# Patient Record
Sex: Female | Born: 1985 | Hispanic: Yes | Marital: Single | Smoking: Current every day smoker
Health system: Southern US, Community
[De-identification: ages and names within clinical notes are randomized; demographics above are authoritative.]

## PROBLEM LIST (undated history)

## (undated) ENCOUNTER — Inpatient Hospital Stay: Payer: Self-pay

## (undated) DIAGNOSIS — I10 Essential (primary) hypertension: Secondary | ICD-10-CM

## (undated) HISTORY — PX: TONSILLECTOMY: SUR1361

---

## 2018-05-01 ENCOUNTER — Emergency Department
Admission: EM | Admit: 2018-05-01 | Discharge: 2018-05-01 | Disposition: A | Discharge status: Home / Self Care | Payer: Medicaid Other | Attending: Emergency Medicine | Admitting: Emergency Medicine

## 2018-05-01 ENCOUNTER — Encounter: Payer: Medicaid Other | Admitting: Obstetrics and Gynecology

## 2018-05-01 ENCOUNTER — Emergency Department: Payer: Medicaid Other

## 2018-05-01 ENCOUNTER — Other Ambulatory Visit: Payer: Self-pay

## 2018-05-01 DIAGNOSIS — O4422 Partial placenta previa NOS or without hemorrhage, second trimester: Secondary | ICD-10-CM | POA: Insufficient documentation

## 2018-05-01 DIAGNOSIS — O26852 Spotting complicating pregnancy, second trimester: Secondary | ICD-10-CM | POA: Insufficient documentation

## 2018-05-01 DIAGNOSIS — Z3A17 17 weeks gestation of pregnancy: Secondary | ICD-10-CM | POA: Diagnosis not present

## 2018-05-01 DIAGNOSIS — O209 Hemorrhage in early pregnancy, unspecified: Secondary | ICD-10-CM | POA: Diagnosis present

## 2018-05-01 DIAGNOSIS — R102 Pelvic and perineal pain: Secondary | ICD-10-CM | POA: Diagnosis not present

## 2018-05-01 DIAGNOSIS — O4690 Antepartum hemorrhage, unspecified, unspecified trimester: Secondary | ICD-10-CM

## 2018-05-01 DIAGNOSIS — O4402 Placenta previa specified as without hemorrhage, second trimester: Secondary | ICD-10-CM

## 2018-05-01 LAB — CBC WITH DIFFERENTIAL/PLATELET
Abs Immature Granulocytes: 0.02 10*3/uL (ref 0.00–0.07)
Basophils Absolute: 0 10*3/uL (ref 0.0–0.1)
Basophils Relative: 0 %
Eosinophils Absolute: 0.1 10*3/uL (ref 0.0–0.5)
Eosinophils Relative: 2 %
HCT: 34.5 % — ABNORMAL LOW (ref 36.0–46.0)
Hemoglobin: 11.3 g/dL — ABNORMAL LOW (ref 12.0–15.0)
Immature Granulocytes: 0 %
Lymphocytes Relative: 30 %
Lymphs Abs: 1.7 10*3/uL (ref 0.7–4.0)
MCH: 28 pg (ref 26.0–34.0)
MCHC: 32.8 g/dL (ref 30.0–36.0)
MCV: 85.4 fL (ref 80.0–100.0)
MONO ABS: 0.3 10*3/uL (ref 0.1–1.0)
Monocytes Relative: 6 %
NEUTROS ABS: 3.3 10*3/uL (ref 1.7–7.7)
Neutrophils Relative %: 62 %
Platelets: 196 10*3/uL (ref 150–400)
RBC: 4.04 MIL/uL (ref 3.87–5.11)
RDW: 12.4 % (ref 11.5–15.5)
WBC: 5.5 10*3/uL (ref 4.0–10.5)
nRBC: 0 % (ref 0.0–0.2)

## 2018-05-01 LAB — URINALYSIS, COMPLETE (UACMP) WITH MICROSCOPIC
Bilirubin Urine: NEGATIVE
Glucose, UA: NEGATIVE mg/dL
Hgb urine dipstick: NEGATIVE
Ketones, ur: NEGATIVE mg/dL
Leukocytes, UA: NEGATIVE
Nitrite: NEGATIVE
Protein, ur: NEGATIVE mg/dL
Specific Gravity, Urine: 1.031 — ABNORMAL HIGH (ref 1.005–1.030)
pH: 5 (ref 5.0–8.0)

## 2018-05-01 LAB — BASIC METABOLIC PANEL
Anion gap: 5 (ref 5–15)
BUN: 9 mg/dL (ref 6–20)
CO2: 25 mmol/L (ref 22–32)
Calcium: 8.6 mg/dL — ABNORMAL LOW (ref 8.9–10.3)
Chloride: 105 mmol/L (ref 98–111)
Creatinine, Ser: 0.35 mg/dL — ABNORMAL LOW (ref 0.44–1.00)
GFR calc Af Amer: >60 mL/min (ref >60)
GFR calc non Af Amer: >60 mL/min (ref >60)
Glucose, Bld: 91 mg/dL (ref 70–99)
Potassium: 3.3 mmol/L — ABNORMAL LOW (ref 3.5–5.1)
Sodium: 135 mmol/L (ref 135–145)

## 2018-05-01 LAB — CHLAMYDIA/NGC RT PCR (ARMC ONLY)
Chlamydia Tr: NOT DETECTED
N gonorrhoeae: NOT DETECTED

## 2018-05-01 LAB — HCG, QUANTITATIVE, PREGNANCY: hCG, Beta Chain, Quant, S: 22329 m[IU]/mL — ABNORMAL HIGH (ref <5)

## 2018-05-01 LAB — TYPE AND SCREEN
ABO/RH(D): A POS
Antibody Screen: NEGATIVE

## 2018-05-01 MED ORDER — ONDANSETRON 4 MG PO TBDP
4.0000 mg | ORAL_TABLET | Freq: Once | ORAL | Status: AC
  Administered 2018-05-01: 4 mg via ORAL
  Filled 2018-05-01: qty 1

## 2018-05-01 MED ORDER — ONDANSETRON 4 MG PO TBDP: 4.0000 mg | ORAL_TABLET | Freq: Three times a day (TID) | ORAL | 0 refills | Status: AC | PRN

## 2018-05-01 NOTE — ED Triage Notes (Signed)
Pt reports that she is [redacted] weeks pregnant states that she started feeling bad yesterday and began vomiting states that she has been under a lot of stress, states that today around 1400 she started with bright red vaginal bleeding and cramping across her lower abd,. Pt states that the pain reminds her of labor, denies pain with urination

## 2018-05-01 NOTE — ED Provider Notes (Signed)
[facility] Emergency Department Provider Note       Time seen: ----------------------------------------- 6:57 PM on 05/01/2018 -----------------------------------------   I have reviewed the triage vital signs and the nursing notes.  HISTORY   Chief Complaint Abdominal Pain and Vaginal Bleeding   HPI Charlotte Stewart is a 33 y.o. female with no significant past medical history who presents to the ED for vaginal bleeding and pregnancy.  Patient states she is around [redacted] weeks pregnant.  She started feeling bad yesterday and began vomiting.  She reports she is under a lot of stress.  Has some bleeding and cramping across her lower abdomen that reminds her of labor.  She is G6 P3 Ab2.  No past medical history on file.  There are no active problems to display for this patient.   Allergies Aspirin  Social History Social History   Tobacco Use  . Smoking status: Not on file  Substance Use Topics  . Alcohol use: Not on file  . Drug use: Not on file    Review of Systems Constitutional: Negative for fever. Cardiovascular: Negative for chest pain. Respiratory: Negative for shortness of breath. Gastrointestinal: Positive for abdominal pain, vomiting Genitourinary: Positive for vaginal bleeding. Musculoskeletal: Negative for back pain. Skin: Negative for rash. Neurological: Negative for headaches, focal weakness or numbness.  All systems negative/normal/unremarkable except as stated in the HPI  ____________________________________________   PHYSICAL EXAM:  VITAL SIGNS: ED Triage Vitals [05/01/18 1836]  Enc Vitals Group     BP 127/63     Pulse Rate 95     Resp 18     Temp 98.2 F (36.8 C)     Temp Source Oral     SpO2 99 %     Weight 255 lb (115.7 kg)     Height 5\' 2"  (1.575 m)     Head Circumference      Peak Flow      Pain Score 8     Pain Loc      Pain Edu?      Excl. in GC?    Constitutional: Alert and oriented. Well appearing and  in no distress. Eyes: Conjunctivae are normal. Normal extraocular movements. Cardiovascular: Normal rate, regular rhythm. No murmurs, rubs, or gallops. Respiratory: Normal respiratory effort without tachypnea nor retractions. Breath sounds are clear and equal bilaterally. No wheezes/rales/rhonchi. Gastrointestinal: Nonfocal tenderness, normal bowel sounds Musculoskeletal: Nontender with normal range of motion in extremities. No lower extremity tenderness nor edema. Neurologic:  Normal speech and language. No gross focal neurologic deficits are appreciated.  Skin:  Skin is warm, dry and intact. No rash noted. Psychiatric: Mood and affect are normal. Speech and behavior are normal.  ____________________________________________  ED COURSE:  As part of my medical decision making, I reviewed the following data within the electronic MEDICAL RECORD NUMBER History obtained from family if available, nursing notes, old chart and ekg, as well as notes from prior ED visits. Patient presented for abdominal pain and vaginal bleeding, we will assess with labs and imaging as indicated at this time.   Procedures ____________________________________________   LABS (pertinent positives/negatives)  Labs Reviewed  HCG, QUANTITATIVE, PREGNANCY - Abnormal; Notable for the following components:      Result Value   hCG, Beta Chain, Quant, S 22,329 (*)    All other components within normal limits  URINALYSIS, COMPLETE (UACMP) WITH MICROSCOPIC - Abnormal; Notable for the following components:   Color, Urine YELLOW (*)    APPearance CLEAR (*)  Specific Gravity, Urine 1.031 (*)    Bacteria, UA RARE (*)    All other components within normal limits  CHLAMYDIA/NGC RT PCR ([facility] ONLY)  CBC WITH DIFFERENTIAL/PLATELET  BASIC METABOLIC PANEL  TYPE AND SCREEN    RADIOLOGY Images were viewed by me  Pregnancy ultrasound IMPRESSION: 1. Single live IUP. 2. No cause for bleeding or pain identified. 3. The  placenta is 1.5 cm from the internal cervical os consistent with a marginal previa. Recommend attention on follow-up.  This exam is performed on an emergent basis and does not comprehensively evaluate fetal size, dating, or anatomy; follow-up complete OB US should be considered if further fetal assessment is warranted. ____________________________________________   DIFFERENTIAL DIAGNOSIS   Miscarriage, abruptio placentae, placenta previa, normal pregnancy  FINAL ASSESSMENT AND PLAN  Vaginal bleeding in the second trimester, marginal placenta previa   Plan: The patient had presented for vaginal bleeding in the second trimester. Patient's labs are reassuring. Patient's imaging revealed marginal placenta previa.  Otherwise no specific etiology is discovered as to why she has been bleeding.  She is encouraged to have close outpatient follow-up.   Ulice Dash, MD    Note: This note was generated in part or whole with voice recognition software. Voice recognition is usually quite accurate but there are transcription errors that can and very often do occur. I apologize for any typographical errors that were not detected and corrected.     Emily Filbert, MD 05/01/18 2105

## 2018-06-11 ENCOUNTER — Observation Stay: Payer: Medicaid Other

## 2018-06-11 ENCOUNTER — Observation Stay
Admission: EM | Admit: 2018-06-11 | Discharge: 2018-06-11 | Disposition: A | Discharge status: Home / Self Care | Payer: Medicaid Other | Attending: Obstetrics and Gynecology | Admitting: Obstetrics and Gynecology

## 2018-06-11 ENCOUNTER — Encounter: Payer: Self-pay | Admitting: *Deleted

## 2018-06-11 DIAGNOSIS — Z3A21 21 weeks gestation of pregnancy: Secondary | ICD-10-CM | POA: Insufficient documentation

## 2018-06-11 DIAGNOSIS — O4412 Placenta previa with hemorrhage, second trimester: Principal | ICD-10-CM | POA: Insufficient documentation

## 2018-06-11 DIAGNOSIS — O4400 Placenta previa specified as without hemorrhage, unspecified trimester: Secondary | ICD-10-CM

## 2018-06-11 DIAGNOSIS — W109XXA Fall (on) (from) unspecified stairs and steps, initial encounter: Secondary | ICD-10-CM

## 2018-06-11 DIAGNOSIS — O9A212 Injury, poisoning and certain other consequences of external causes complicating pregnancy, second trimester: Secondary | ICD-10-CM | POA: Diagnosis not present

## 2018-06-11 DIAGNOSIS — O4440 Low lying placenta NOS or without hemorrhage, unspecified trimester: Secondary | ICD-10-CM

## 2018-06-11 DIAGNOSIS — O09292 Supervision of pregnancy with other poor reproductive or obstetric history, second trimester: Secondary | ICD-10-CM

## 2018-06-11 DIAGNOSIS — Z3A20 20 weeks gestation of pregnancy: Secondary | ICD-10-CM

## 2018-06-11 LAB — COMPREHENSIVE METABOLIC PANEL
ALT: 24 U/L (ref 0–44)
AST: 19 U/L (ref 15–41)
Albumin: 3.4 g/dL — ABNORMAL LOW (ref 3.5–5.0)
Alkaline Phosphatase: 80 U/L (ref 38–126)
Anion gap: 11 (ref 5–15)
BUN: 9 mg/dL (ref 6–20)
CO2: 21 mmol/L — ABNORMAL LOW (ref 22–32)
Calcium: 9.4 mg/dL (ref 8.9–10.3)
Chloride: 105 mmol/L (ref 98–111)
Creatinine, Ser: 0.35 mg/dL — ABNORMAL LOW (ref 0.44–1.00)
GFR calc non Af Amer: >60 mL/min (ref >60)
Glucose, Bld: 99 mg/dL (ref 70–99)
Potassium: 3.7 mmol/L (ref 3.5–5.1)
Sodium: 137 mmol/L (ref 135–145)
Total Bilirubin: 0.3 mg/dL (ref 0.3–1.2)
Total Protein: 6.8 g/dL (ref 6.5–8.1)

## 2018-06-11 LAB — URINALYSIS, COMPLETE (UACMP) WITH MICROSCOPIC
Bacteria, UA: NONE SEEN
Bilirubin Urine: NEGATIVE
GLUCOSE, UA: NEGATIVE mg/dL
Hgb urine dipstick: NEGATIVE
Ketones, ur: 5 mg/dL — AB
Leukocytes,Ua: NEGATIVE
Nitrite: NEGATIVE
Protein, ur: NEGATIVE mg/dL
Specific Gravity, Urine: 1.024 (ref 1.005–1.030)
pH: 6 (ref 5.0–8.0)

## 2018-06-11 LAB — KLEIHAUER-BETKE STAIN
Fetal Cells %: 0 %
QUANTITATION FETAL HEMOGLOBIN: 0 mL

## 2018-06-11 LAB — PROTEIN / CREATININE RATIO, URINE
Creatinine, Urine: 135 mg/dL
Protein Creatinine Ratio: 0.05 mg/mg{Cre} (ref 0.00–0.15)
Total Protein, Urine: 7 mg/dL

## 2018-06-11 LAB — CBC
HCT: 32 % — ABNORMAL LOW (ref 36.0–46.0)
Hemoglobin: 10.8 g/dL — ABNORMAL LOW (ref 12.0–15.0)
MCH: 28.3 pg (ref 26.0–34.0)
MCHC: 33.8 g/dL (ref 30.0–36.0)
MCV: 83.8 fL (ref 80.0–100.0)
Platelets: 184 10*3/uL (ref 150–400)
RBC: 3.82 MIL/uL — ABNORMAL LOW (ref 3.87–5.11)
RDW: 12.6 % (ref 11.5–15.5)
WBC: 8.5 10*3/uL (ref 4.0–10.5)
nRBC: 0 % (ref 0.0–0.2)

## 2018-06-11 LAB — URINE DRUG SCREEN, QUALITATIVE (ARMC ONLY)
Amphetamines, Ur Screen: NOT DETECTED
Barbiturates, Ur Screen: NOT DETECTED
Benzodiazepine, Ur Scrn: NOT DETECTED
Cannabinoid 50 Ng, Ur ~~LOC~~: NOT DETECTED
Cocaine Metabolite,Ur ~~LOC~~: NOT DETECTED
MDMA (Ecstasy)Ur Screen: NOT DETECTED
METHADONE SCREEN, URINE: NOT DETECTED
Opiate, Ur Screen: NOT DETECTED
Phencyclidine (PCP) Ur S: NOT DETECTED
TRICYCLIC, UR SCREEN: NOT DETECTED

## 2018-06-11 MED ORDER — BUPRENORPHINE HCL-NALOXONE HCL 8-2 MG SL SUBL: 1.0000 | SUBLINGUAL_TABLET | Freq: Once | SUBLINGUAL | Status: DC

## 2018-06-11 MED ORDER — LACTATED RINGERS IV SOLN: INTRAVENOUS | Status: DC

## 2018-06-11 MED ORDER — LACTATED RINGERS IV BOLUS: 1000.0000 mL | Freq: Once | INTRAVENOUS | Status: DC

## 2018-06-11 MED ORDER — BUPRENORPHINE HCL 8 MG SL SUBL
8.0000 mg | SUBLINGUAL_TABLET | Freq: Two times a day (BID) | SUBLINGUAL | Status: DC
  Administered 2018-06-11: 8 mg via SUBLINGUAL
  Filled 2018-06-11 (×2): qty 1

## 2018-06-11 NOTE — Discharge Instructions (Signed)
Preterm Labor and Birth Information °Pregnancy normally lasts 39-41 weeks. Preterm labor is when labor starts early. It starts before you have been pregnant for 37 whole weeks. °What are the risk factors for preterm labor? °Preterm labor is more likely to occur in women who: °· Have an infection while pregnant. °· Have a cervix that is short. °· Have gone into preterm labor before. °· Have had surgery on their cervix. °· Are younger than age 31. °· Are older than age 35. °· Are African American. °· Are pregnant with two or more babies. °· Take street drugs while pregnant. °· Smoke while pregnant. °· Do not gain enough weight while pregnant. °· Got pregnant right after another pregnancy. °What are the symptoms of preterm labor? °Symptoms of preterm labor include: °· Cramps. The cramps may feel like the cramps some women get during their period. The cramps may happen with watery poop (diarrhea). °· Pain in the belly (abdomen). °· Pain in the lower back. °· Regular contractions or tightening. It may feel like your belly is getting tighter. °· Pressure in the lower belly that seems to get stronger. °· More fluid (discharge) leaking from the vagina. The fluid may be watery or bloody. °· Water breaking. °Why is it important to notice signs of preterm labor? °Babies who are born early may not be fully developed. They have a higher chance for: °· Long-term heart problems. °· Long-term lung problems. °· Trouble controlling body systems, like breathing. °· Bleeding in the brain. °· A condition called cerebral palsy. °· Learning difficulties. °· Death. °These risks are highest for babies who are born before 34 weeks of pregnancy. °How is preterm labor treated? °Treatment depends on: °· How long you were pregnant. °· Your condition. °· The health of your baby. °Treatment may involve: °· Having a stitch (suture) placed in your cervix. When you give birth, your cervix opens so the baby can come out. The stitch keeps the cervix  from opening too soon. °· Staying at the hospital. °· Taking or getting medicines, such as: °? Hormone medicines. °? Medicines to stop contractions. °? Medicines to help the baby’s lungs develop. °? Medicines to prevent your baby from having cerebral palsy. °What should I do if I am in preterm labor? °If you think you are going into labor too soon, call your doctor right away. °How can I prevent preterm labor? °· Do not use any tobacco products. °? Examples of these are cigarettes, chewing tobacco, and e-cigarettes. °? If you need help quitting, ask your doctor. °· Do not use street drugs. °· Do not use any medicines unless you ask your doctor if they are safe for you. °· Talk with your doctor before taking any herbal supplements. °· Make sure you gain enough weight. °· Watch for infection. If you think you might have an infection, get it checked right away. °· If you have gone into preterm labor before, tell your doctor. °This information is not intended to replace advice given to you by your health care provider. Make sure you discuss any questions you have with your health care provider. °Document Released: 06/28/2008 Document Revised: 09/12/2015 Document Reviewed: 08/23/2015 °Elsevier Interactive Patient Education © 2019 [employer] ° °
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
## 2018-06-12 LAB — URINE CULTURE

## 2018-06-12 NOTE — Progress Notes (Signed)
This was a patient you saw in triage

## 2018-07-08 ENCOUNTER — Other Ambulatory Visit: Payer: Self-pay

## 2018-07-08 ENCOUNTER — Observation Stay
Admission: EM | Admit: 2018-07-08 | Discharge: 2018-07-08 | Discharge status: Against Medical Advice | Payer: Medicaid Other | Attending: Obstetrics and Gynecology | Admitting: Obstetrics and Gynecology

## 2018-07-08 DIAGNOSIS — Z3A26 26 weeks gestation of pregnancy: Secondary | ICD-10-CM | POA: Insufficient documentation

## 2018-07-08 DIAGNOSIS — O26892 Other specified pregnancy related conditions, second trimester: Principal | ICD-10-CM | POA: Insufficient documentation

## 2018-07-08 DIAGNOSIS — R110 Nausea: Secondary | ICD-10-CM | POA: Diagnosis present

## 2018-07-08 HISTORY — DX: Essential (primary) hypertension: I10

## 2018-07-08 MED ORDER — LACTATED RINGERS IV BOLUS: 500.0000 mL | Freq: Once | INTRAVENOUS | Status: DC

## 2018-07-08 MED ORDER — ONDANSETRON HCL 4 MG/2ML IJ SOLN: 4.0000 mg | Freq: Four times a day (QID) | INTRAMUSCULAR | Status: DC | PRN

## 2018-07-08 MED ORDER — ACETAMINOPHEN 325 MG PO TABS: 650.0000 mg | ORAL_TABLET | ORAL | Status: DC | PRN

## 2018-07-08 NOTE — OB Triage Note (Signed)
Pt reports she is a G5P3 at 26w with c/o N/V/D since yesterday and hasnt been able to keep anything down. Pt reports she is dehydrated and has body aches. VSS. Monitors applied. Pt reports she has had some recent clearish d/c. Pt reports she has been taking 8mg  of subutex for 4-5 years. Pt also takes wellbutrin and zoloft. Pt reports that she hasnt taken any of these meds since she ran out on Monday. Pt reports that she goes to [facility] for her prescriptions however she states her doctor was not there on Monday to refill. Report given to [facility]. Will continue to monitor.

## 2018-07-08 NOTE — Progress Notes (Signed)
At 1710 pt states she is going to leave due to not being able to get subutex here. Pt left AMA at 1715. Provider notified.

## 2018-07-08 NOTE — Final Progress Note (Signed)
Patient arrived in triage with multiple complaints, including nausea with inability to tolerate food and fluids for two days and a recent  increase in vaginal discharge. She noted that she usually takes Subutex and has not been able to get her prescription refilled at [facility] because the authorizing provider was not there on Monday. Therefore, she has not taken any Subutex since then and feels that she is withdrawing. I discussed the plan of care to assess for dehydration, give IV fluids and antiemetics, and collect a wet prep with her. She then asked if it would be possible for her to receive Subutex in triage. After hearing that I did not feel comfortable without authorization from her prescribing provider, she stated that she planned to leave. I asked her to wait while I conferred with Dr. Jerene Pitch. During the time that I was away from the room on the telephone, the patient decided to leave AMA and refused to sign any paperwork when it was presented by the RN.

## 2018-09-30 ENCOUNTER — Other Ambulatory Visit: Payer: Self-pay

## 2018-09-30 ENCOUNTER — Observation Stay
Admission: EM | Admit: 2018-09-30 | Discharge: 2018-09-30 | Disposition: A | Discharge status: Home / Self Care | Payer: Medicaid Other | Attending: Obstetrics & Gynecology | Admitting: Obstetrics & Gynecology

## 2018-09-30 DIAGNOSIS — Z886 Allergy status to analgesic agent status: Secondary | ICD-10-CM | POA: Diagnosis not present

## 2018-09-30 DIAGNOSIS — Z3A35 35 weeks gestation of pregnancy: Secondary | ICD-10-CM

## 2018-09-30 DIAGNOSIS — R109 Unspecified abdominal pain: Secondary | ICD-10-CM

## 2018-09-30 DIAGNOSIS — O10913 Unspecified pre-existing hypertension complicating pregnancy, third trimester: Secondary | ICD-10-CM | POA: Insufficient documentation

## 2018-09-30 DIAGNOSIS — O26893 Other specified pregnancy related conditions, third trimester: Secondary | ICD-10-CM

## 2018-09-30 DIAGNOSIS — O99333 Smoking (tobacco) complicating pregnancy, third trimester: Secondary | ICD-10-CM | POA: Diagnosis not present

## 2018-09-30 DIAGNOSIS — Z79899 Other long term (current) drug therapy: Secondary | ICD-10-CM | POA: Insufficient documentation

## 2018-09-30 DIAGNOSIS — F17210 Nicotine dependence, cigarettes, uncomplicated: Secondary | ICD-10-CM | POA: Diagnosis not present

## 2018-09-30 LAB — URINALYSIS, COMPLETE (UACMP) WITH MICROSCOPIC
Bilirubin Urine: NEGATIVE
Glucose, UA: NEGATIVE mg/dL
Hgb urine dipstick: NEGATIVE
Ketones, ur: NEGATIVE mg/dL
Nitrite: NEGATIVE
Protein, ur: NEGATIVE mg/dL
Specific Gravity, Urine: 1.023 (ref 1.005–1.030)
pH: 7 (ref 5.0–8.0)

## 2018-09-30 LAB — URINE DRUG SCREEN, QUALITATIVE (ARMC ONLY)
Amphetamines, Ur Screen: POSITIVE — AB
Barbiturates, Ur Screen: NOT DETECTED
Benzodiazepine, Ur Scrn: NOT DETECTED
Cannabinoid 50 Ng, Ur ~~LOC~~: NOT DETECTED
Cocaine Metabolite,Ur ~~LOC~~: NOT DETECTED
MDMA (Ecstasy)Ur Screen: NOT DETECTED
Methadone Scn, Ur: NOT DETECTED
Opiate, Ur Screen: NOT DETECTED
Phencyclidine (PCP) Ur S: NOT DETECTED
Tricyclic, Ur Screen: NOT DETECTED

## 2018-09-30 LAB — RUPTURE OF MEMBRANE (ROM)PLUS: Rom Plus: NEGATIVE

## 2018-09-30 MED ORDER — CEPHALEXIN 500 MG PO CAPS: 500.0000 mg | ORAL_CAPSULE | Freq: Two times a day (BID) | ORAL | 0 refills | Status: AC

## 2018-09-30 MED ORDER — ACETAMINOPHEN 500 MG PO TABS: 1000.0000 mg | ORAL_TABLET | Freq: Four times a day (QID) | ORAL | Status: DC | PRN

## 2018-09-30 NOTE — OB Triage Note (Addendum)
Pt G6P3 presents to L&D via EMS c/o contractions 5 minutes apart lasting 1-3 minutes and leaking of fluid. Denies vaginal bleeding and states positive fetal movement. Pt was seen at [facility] for a labor eval at 5am. Pt's  cervix was checked, pt was 3cm and did not proress, pt was sent home from [facility]. Pt went home and contractions continued which promoted pt to come to [facility] to get evaluated. External monitors applied, Initial FHT 155. VS WNL. Deeann Saint, CNM at bedside. ROM plus test sent.

## 2018-09-30 NOTE — Discharge Summary (Signed)
See Final Progress Note 09/30/2018.

## 2018-09-30 NOTE — Final Progress Note (Signed)
Physician Final Progress Note  Patient ID: Charlotte Stewart MRN: [id number] DOB/AGE: 31/09/1985 30 y.o.  Admit date: 09/30/2018 Admitting provider: Nadara Mustardobert P Harris, MD Discharge date: 09/30/2018  Admission Diagnoses: Abdominal pain in pregnancy  Discharge Diagnoses: Uterine irritability, UTI  History of Present Illness: The patient is a 33 y.o. female [phone] [id number] at [id number] who presents for intermittent abdominal pain since 0100 this morning. She was seen in triage at [facility] this morning around 0900 and states that she was found to be 3 cm dilated and discharged because her cervix did not change. She rates her pain as 10/10. She has not taken any medications except Subutex this morning. She complains that she feels wet like some fluid is leaking. She denies vaginal bleeding. Her baby is active and moving well.  Review of Systems: Review of systems negative unless otherwise noted in HPI.   Past Medical History:  Diagnosis Date  . Hypertension     History reviewed. No pertinent surgical history.  No current facility-administered medications on file prior to encounter.    Current Outpatient Medications on File Prior to Encounter  Medication Sig Dispense Refill  . buprenorphine (SUBUTEX) 8 MG SUBL SL tablet Place under the tongue 2 (two) times daily.    . ondansetron (ZOFRAN ODT) 4 MG disintegrating tablet Take 1 tablet (4 mg total) by mouth every 8 (eight) hours as needed for nausea or vomiting. 20 tablet 0  . buPROPion (WELLBUTRIN) 100 MG tablet Take 100 mg by mouth once.    . sertraline (ZOLOFT) 50 MG tablet Take 50 mg by mouth daily.      Allergies  Allergen Reactions  . Aspirin Other (See Comments)    Nose bleeds  . Strawberry Extract     Social History   Socioeconomic History  . Marital status: Single    Spouse name: Not on file  . Number of children: Not on file  . Years of education: Not on file  . Highest education level: Not on file  Occupational History  . Not  on file  Social Needs  . Financial resource strain: Not on file  . Food insecurity    Worry: Not on file    Inability: Not on file  . Transportation needs    Medical: Not on file    Non-medical: Not on file  Tobacco Use  . Smoking status: Current Every Day Smoker    Packs/day: 0.25  Substance and Sexual Activity  . Alcohol use: Not Currently  . Drug use: Not Currently  . Sexual activity: Not Currently  Lifestyle  . Physical activity    Days per week: Not on file    Minutes per session: Not on file  . Stress: Not on file  Relationships  . Social Musicianconnections    Talks on phone: Not on file    Gets together: Not on file    Attends religious service: Not on file    Active member of club or organization: Not on file    Attends meetings of clubs or organizations: Not on file    Relationship status: Not on file  . Intimate partner violence    Fear of current or ex partner: Not on file    Emotionally abused: Not on file    Physically abused: Not on file    Forced sexual activity: Not on file  Other Topics Concern  . Not on file  Social History Narrative  . Not on file    Family history: Negative/unremarkable except as  detailed in HPI.    Physical Exam: BP 124/67   Pulse 81   Temp 98.4 F (36.9 C) (Oral)   Resp 18   Ht 5\' 2"  (1.575 m)   Wt 117.9 kg   LMP 12/31/2017   BMI 47.55 kg/m   Gen: Appears distressed, moving around in bed frequently Pulm: No increased work of breathing Abdomen: gravid, non-tender, no palpable uterine contractions Pelvic: 3/50/-3, cervix posterior, no fluid visible. Cervical exam unchanged on recheck after 1.5 hours of observation Ext: no signs of DVT, excoriation on skin BLE  NST Baseline: 145 Variability: moderate Accelerations: present Decelerations: absent Tocometry: uterine irritability, occasional contraction The patient was monitored for >30 minutes, fetal heart rate tracing was deemed reactive.  Significant Findings/ Diagnostic  Studies: labs: ROM Plus Negative, UA positive for small leukocytes and many bacteria  Procedures: NST  Discharge Condition: stable  Disposition: Discharge disposition: 01-Home or Self Care       Diet: Regular diet  Discharge Activity: Activity as tolerated  Discharge Instructions    Discharge activity:  No Restrictions   Complete by: As directed    Discharge diet:  No restrictions   Complete by: As directed    Fetal Kick Count:  Lie on our left side for one hour after a meal, and count the number of times your baby kicks.  If it is less than 5 times, get up, move around and drink some juice.  Repeat the test 30 minutes later.  If it is still less than 5 kicks in an hour, notify your doctor.   Complete by: As directed    LABOR:  When conractions begin, you should start to time them from the beginning of one contraction to the beginning  of the next.  When contractions are 5 - 10 minutes apart or less and have been regular for at least an hour, you should call your health care provider.   Complete by: As directed    No sexual activity restrictions   Complete by: As directed    Notify physician for bleeding from the vagina   Complete by: As directed    Notify physician for blurring of vision or spots before the eyes   Complete by: As directed    Notify physician for chills or fever   Complete by: As directed    Notify physician for fainting spells, "black outs" or loss of consciousness   Complete by: As directed    Notify physician for increase in vaginal discharge   Complete by: As directed    Notify physician for leaking of fluid   Complete by: As directed    Notify physician for pain or burning when urinating   Complete by: As directed    Notify physician for pelvic pressure (sudden increase)   Complete by: As directed    Notify physician for severe or continued nausea or vomiting   Complete by: As directed    Notify physician for sudden gushing of fluid from the vagina  (with or without continued leaking)   Complete by: As directed    Notify physician for sudden, constant, or occasional abdominal pain   Complete by: As directed    Notify physician if baby moving less than usual   Complete by: As directed      Allergies as of 09/30/2018      Reactions   Aspirin Other (See Comments)   Nose bleeds   Strawberry Extract       Medication List  TAKE these medications   buprenorphine 8 MG Subl SL tablet Commonly known as: SUBUTEX Place under the tongue 2 (two) times daily.   buPROPion 100 MG tablet Commonly known as: WELLBUTRIN Take 100 mg by mouth once.   cephALEXin 500 MG capsule Commonly known as: KEFLEX Take 1 capsule (500 mg total) by mouth 2 (two) times daily for 7 days.   ondansetron 4 MG disintegrating tablet Commonly known as: Zofran ODT Take 1 tablet (4 mg total) by mouth every 8 (eight) hours as needed for nausea or vomiting.   sertraline 50 MG tablet Commonly known as: ZOLOFT Take 50 mg by mouth daily.      No evidence of ROM or preterm labor. Discussed findings suggestive of urinary tract infection and that she is most likely experiencing uterine irritability because of this. Prescribed antibiotics and suggested Tylenol, hydration, and rest with Benadryl for sleep if needed. Patient states that she missed her scheduled prenatal appointment today because she was here, advised to follow up with provider at [facility] to be seen for a prenatal visit.  Signed: Rexene Agent, CNM  09/30/2018, 6:12 PM

## 2018-10-02 LAB — URINE CULTURE: Culture: <10000 — AB

## 2018-11-09 ENCOUNTER — Telehealth: Payer: Self-pay | Admitting: Licensed Clinical Social Worker

## 2018-11-09 NOTE — Telephone Encounter (Signed)
Referred by Kieth Brightly due to postpartum symptoms. LCSW attempted to call patient - left vm encouraging pt. To return call to office.

## 2018-11-10 NOTE — Telephone Encounter (Signed)
Spoke with patient and informed her that she was referred by Kieth Brightly. Patient declined services and reported that she has a psychiatrist whom she is seeing today and that she already talks with so many people. LCSW encouraged patient to reach out in future, is needed.

## 2019-01-21 ENCOUNTER — Encounter (HOSPITAL_COMMUNITY): Payer: Self-pay

## 2019-02-10 IMAGING — US US OB LIMITED
1 series · 14 of 28 positions shown · non-contrast
Comparison: none

CLINICAL DATA: The patient is 17 weeks pregnant by report.
Abdominal pain and vaginal bleeding.

EXAM:
LIMITED OBSTETRIC ULTRASOUND

[Series 1: us ob limited · 14 of 52 slices shown]
[im 2/52]
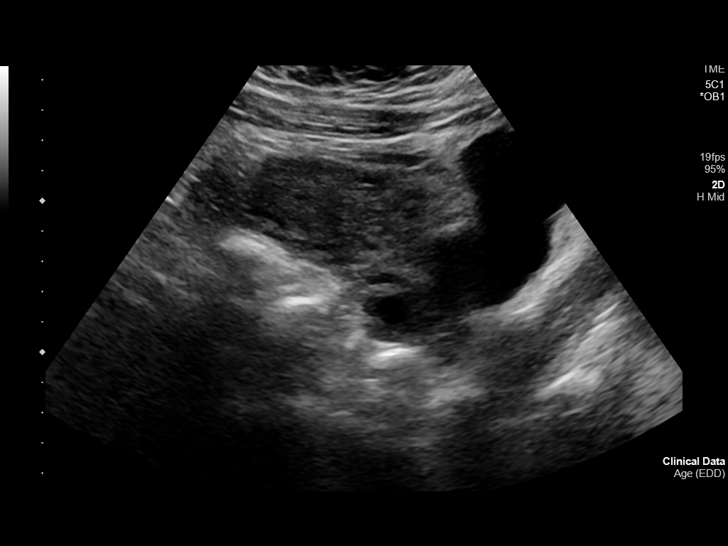
[im 6/52]
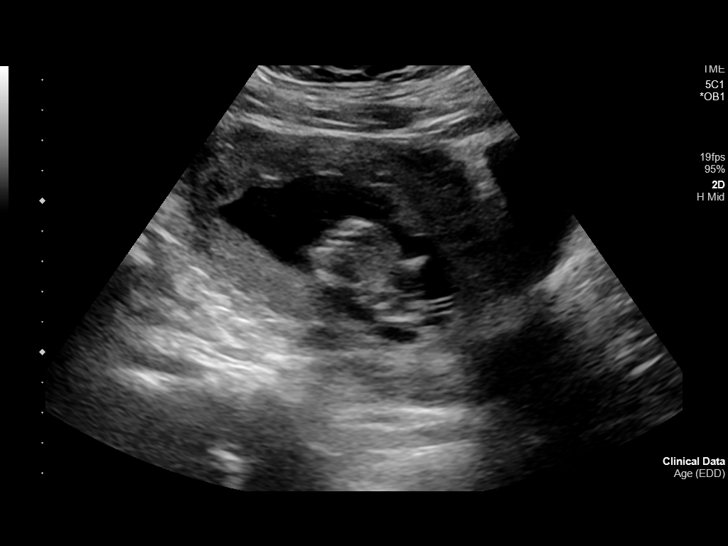
[im 10/52]
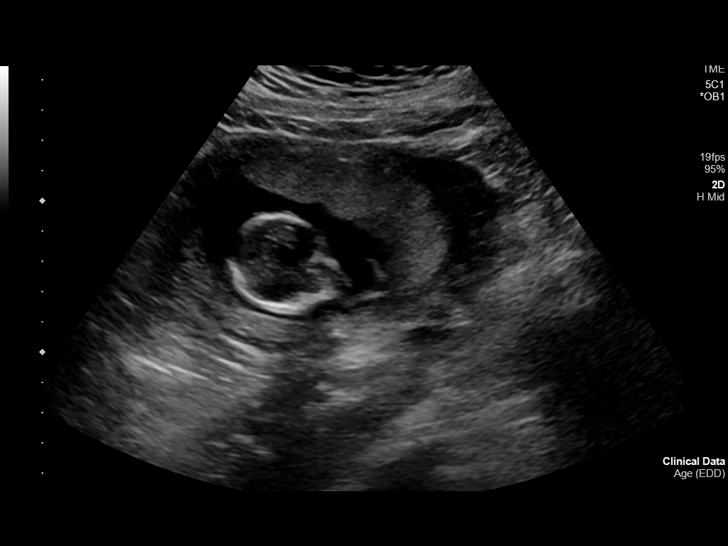
[im 14/52]
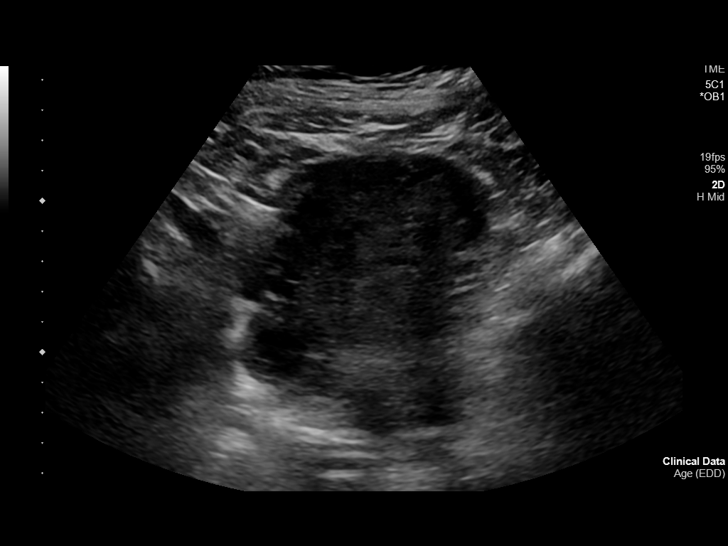
[im 18/52]
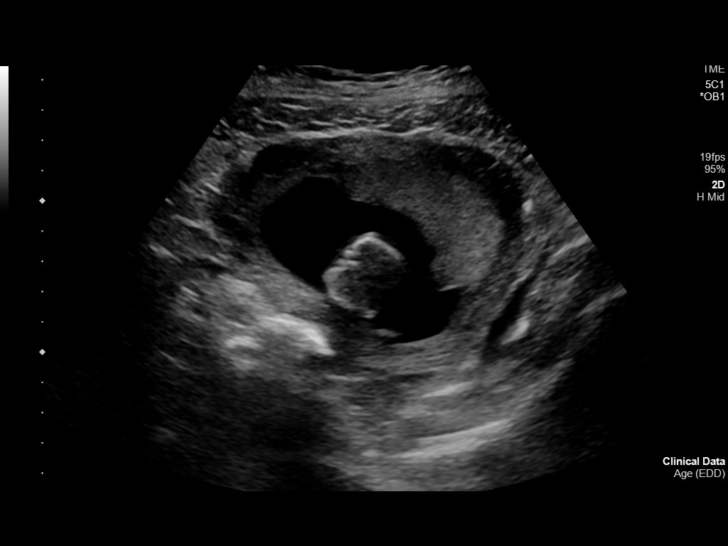
[im 21/52]
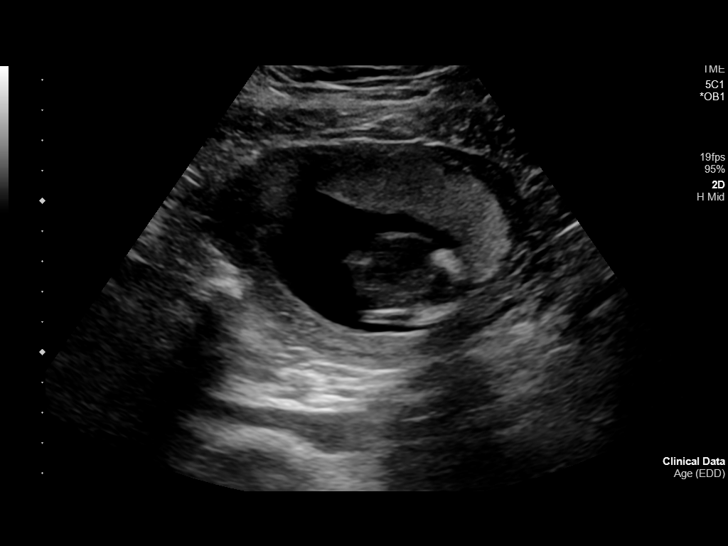
[im 25/52]
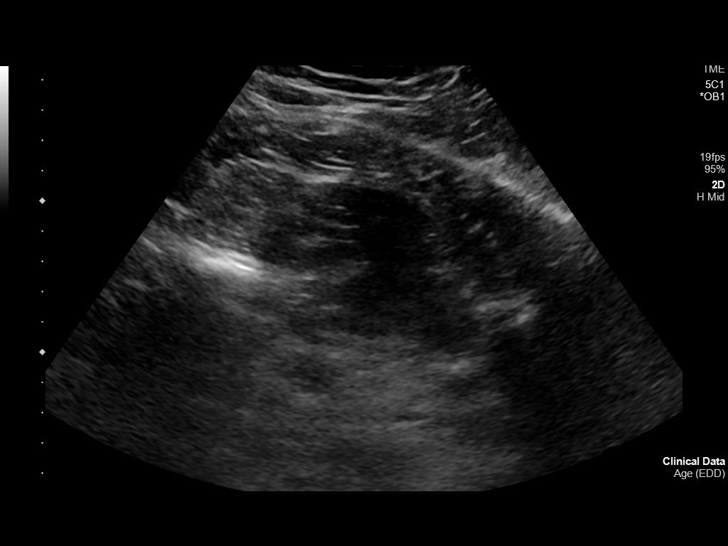
[im 29/52]
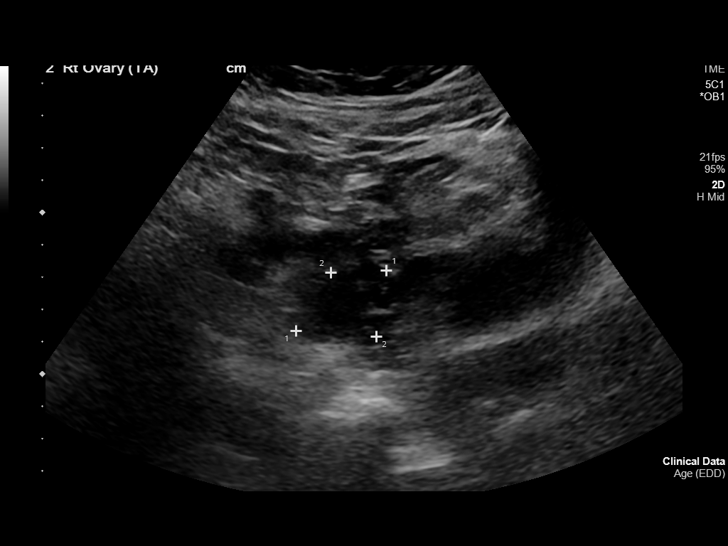
[im 33/52]
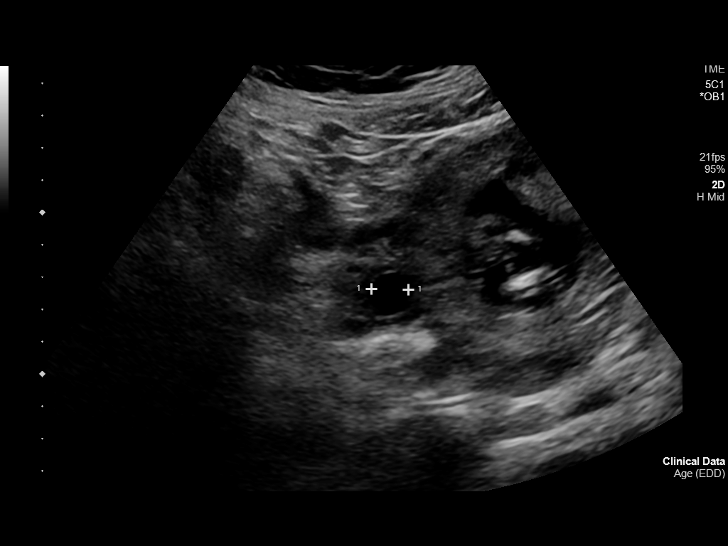
[im 36/52]
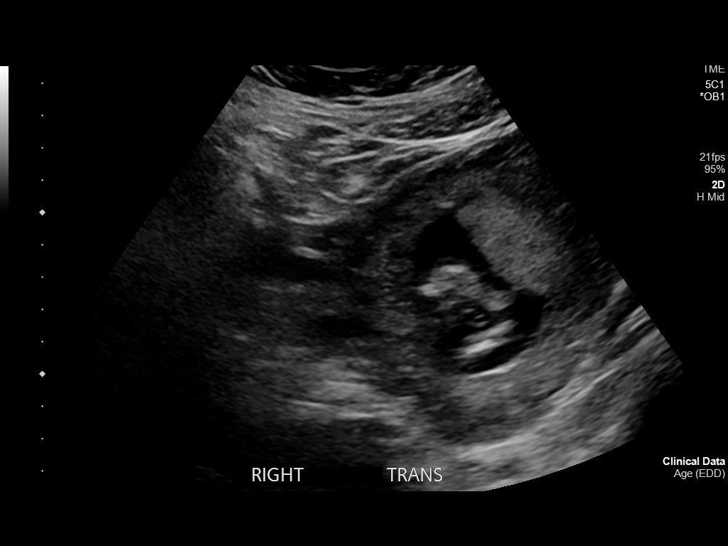
[im 40/52]
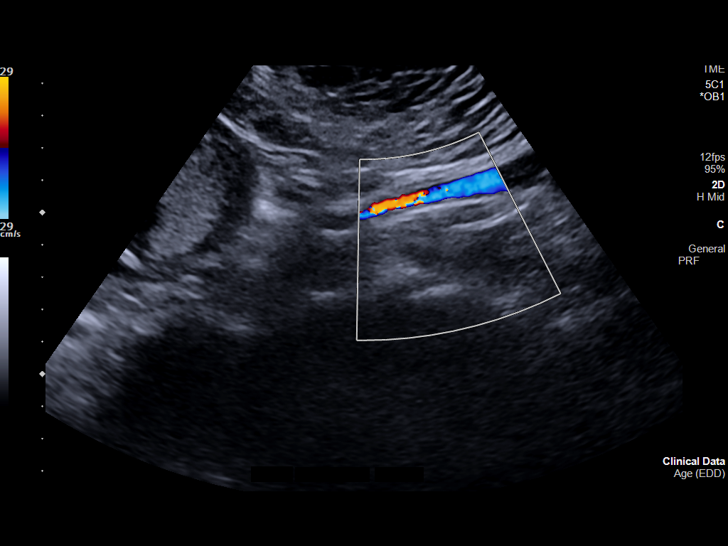
[im 44/52]
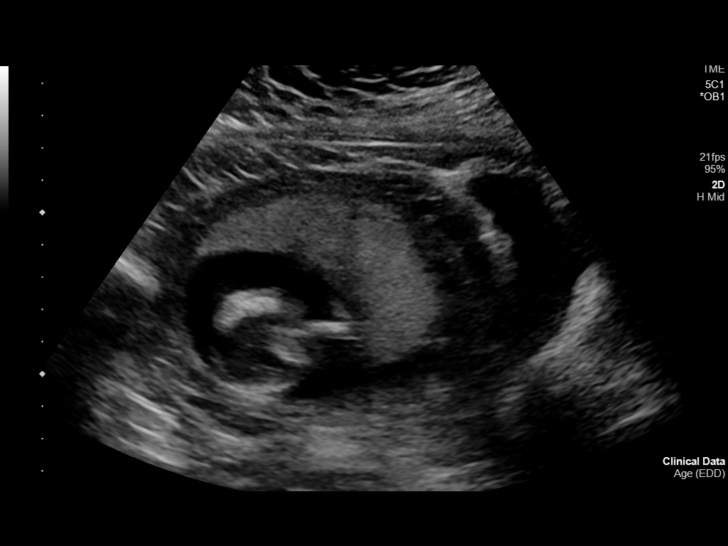
[im 48/52]
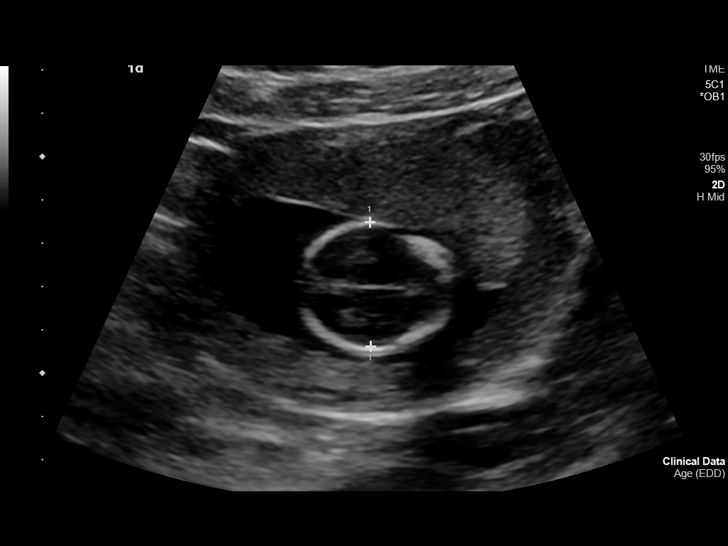
[im 52/52]
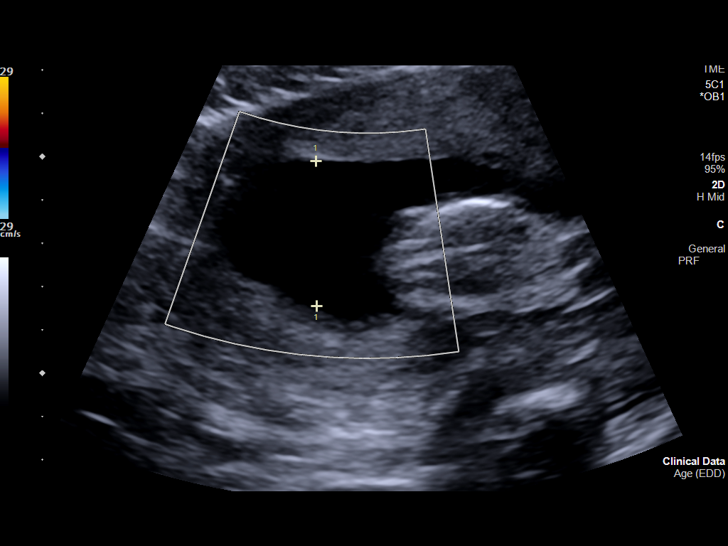

[14 of 28 positions shown; findings below may reference images not displayed]

FINDINGS: Number of Fetuses: 1

Heart Rate:  141 bpm

Movement: Yes

Presentation: Breech

Placental Location: Anterior

Previa: The inferior edge of the placenta is 1.5 cm from the
internal cervical os consistent with a marginal previa.

Amniotic Fluid (Subjective):  Within normal limits.

BPD: 2.9 cm 15 w  1 d

MATERNAL FINDINGS:

Cervix:  Appears closed.

Uterus/Adnexae: Left ovary not visualized. Corpus luteum cyst in the
right ovary.
IMPRESSION: 1. Single live IUP.
2. No cause for bleeding or pain identified.
3. The placenta is 1.5 cm from the internal cervical os consistent
with a marginal previa. Recommend attention on follow-up.

This exam is performed on an emergent basis and does not
comprehensively evaluate fetal size, dating, or anatomy; follow-up
complete OB US should be considered if further fetal assessment is
warranted.

## 2019-03-23 ENCOUNTER — Other Ambulatory Visit: Payer: Self-pay

## 2019-03-23 ENCOUNTER — Encounter: Payer: Self-pay | Admitting: Emergency Medicine

## 2019-03-23 ENCOUNTER — Emergency Department
Admission: EM | Admit: 2019-03-23 | Discharge: 2019-03-23 | Disposition: A | Discharge status: Home / Self Care | Payer: Medicaid Other | Attending: Emergency Medicine | Admitting: Emergency Medicine

## 2019-03-23 DIAGNOSIS — I10 Essential (primary) hypertension: Secondary | ICD-10-CM | POA: Insufficient documentation

## 2019-03-23 DIAGNOSIS — Z79899 Other long term (current) drug therapy: Secondary | ICD-10-CM | POA: Diagnosis not present

## 2019-03-23 DIAGNOSIS — W57XXXA Bitten or stung by nonvenomous insect and other nonvenomous arthropods, initial encounter: Secondary | ICD-10-CM | POA: Diagnosis not present

## 2019-03-23 DIAGNOSIS — F17210 Nicotine dependence, cigarettes, uncomplicated: Secondary | ICD-10-CM | POA: Insufficient documentation

## 2019-03-23 DIAGNOSIS — B888 Other specified infestations: Secondary | ICD-10-CM | POA: Insufficient documentation

## 2019-03-23 IMAGING — US LIMITED OBSTETRIC ULTRASOUND
1 series · 14 of 24 positions shown · non-contrast
Comparison: Obstetrical ultrasound May 01, 2018.

CLINICAL DATA: Vaginal bleeding after falling down 4 stairs.
Gestational age by last menstrual period 21 weeks and 0 days.

EXAM:
LIMITED OBSTETRIC ULTRASOUND

[Series 1: limited obstetric ultrasound · 0.33mm/px · 14 of 24 slices shown]
[im 1/24]
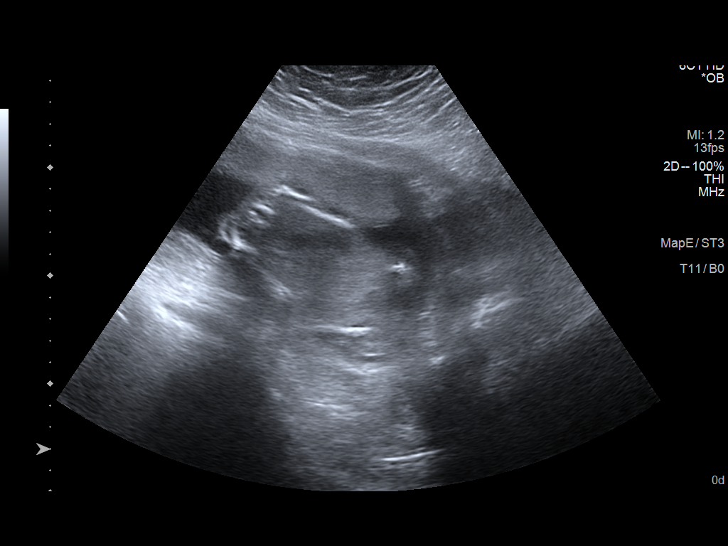
[im 3/24]
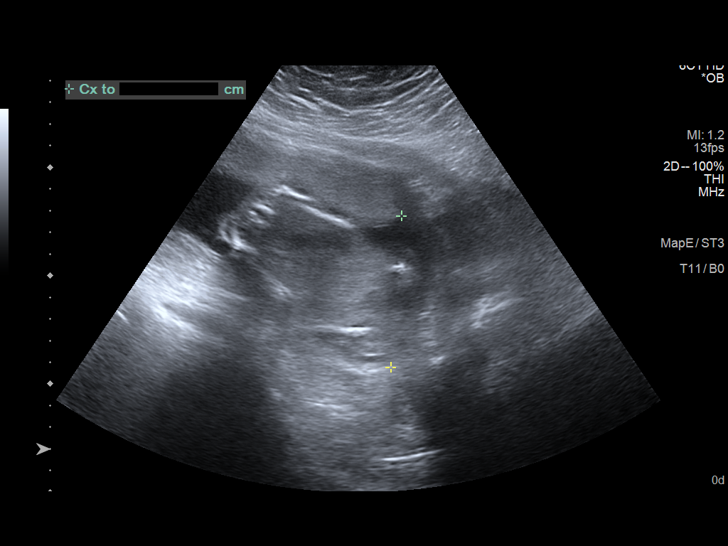
[im 5/24]
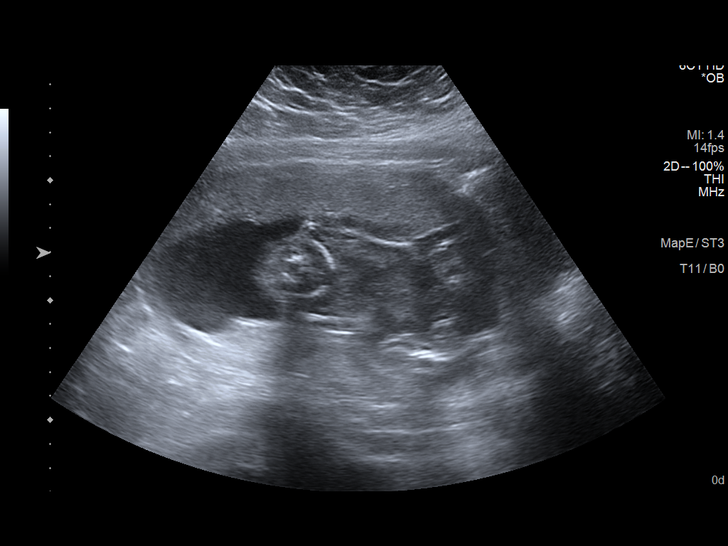
[im 7/24]
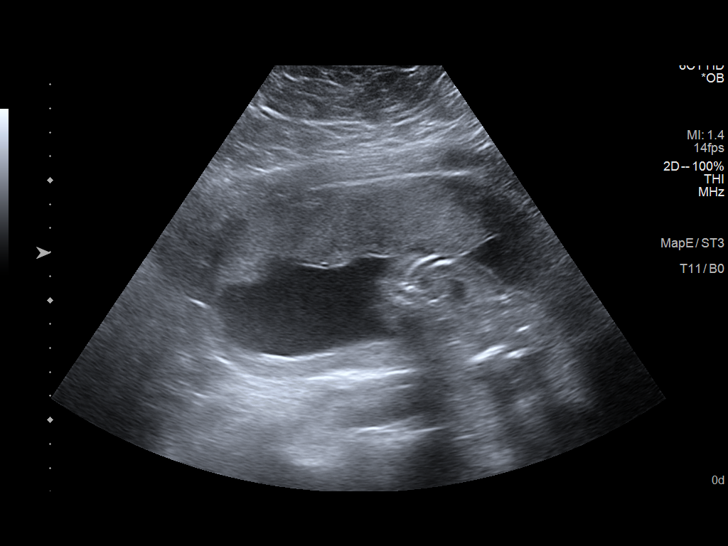
[im 8/24]
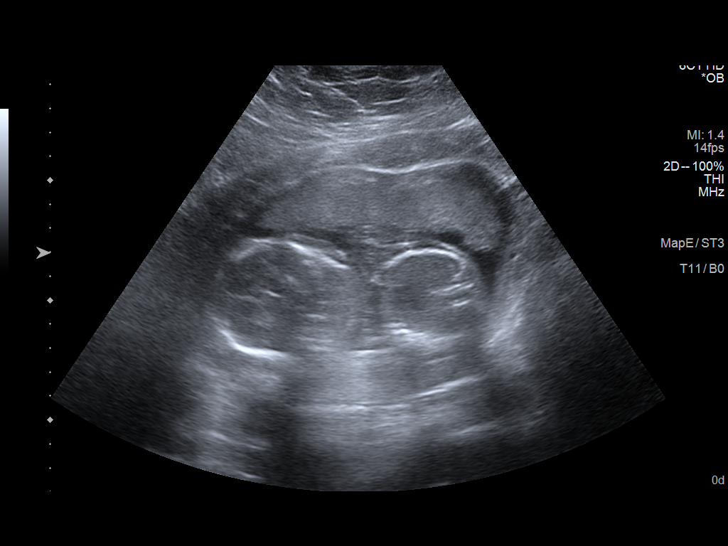
[im 10/24]
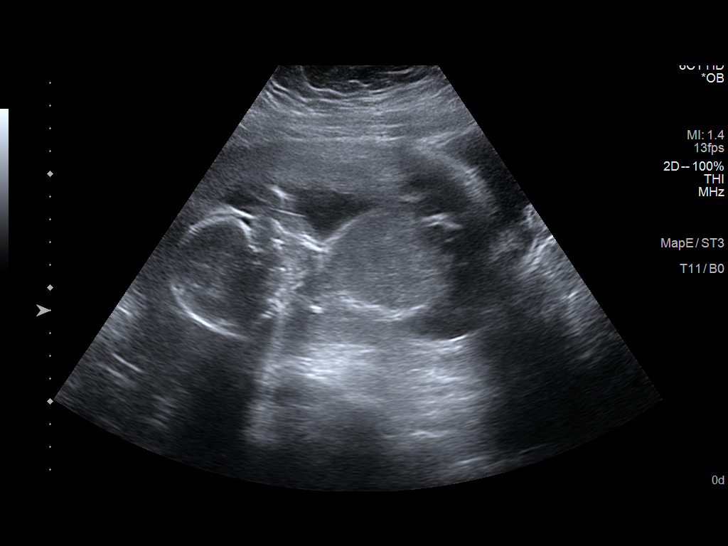
[im 12/24]
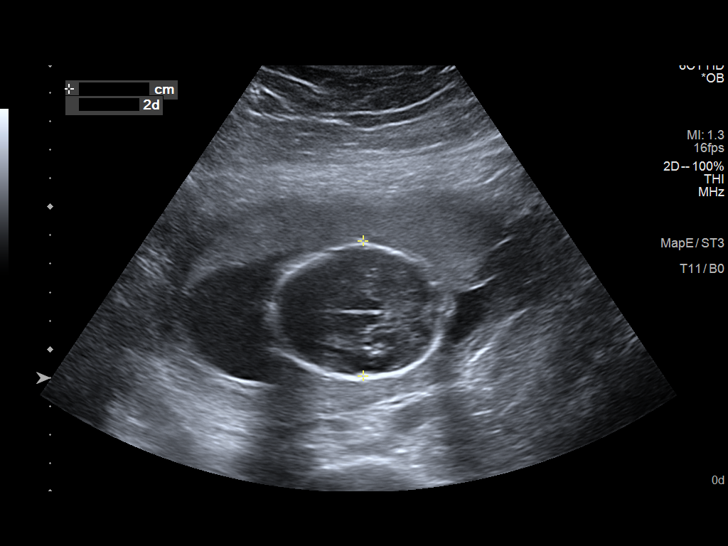
[im 13/24]
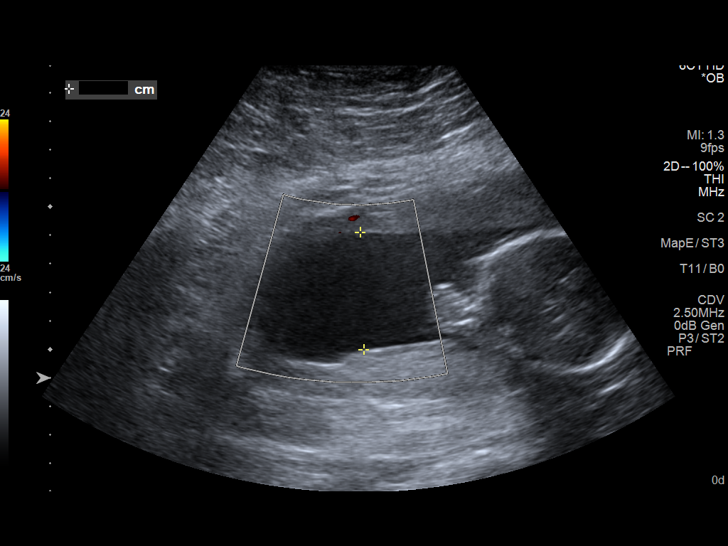
[im 15/24]
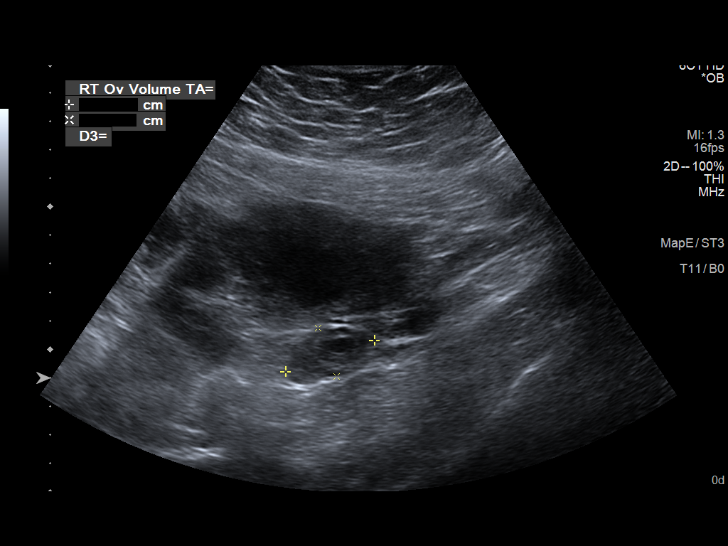
[im 17/24]
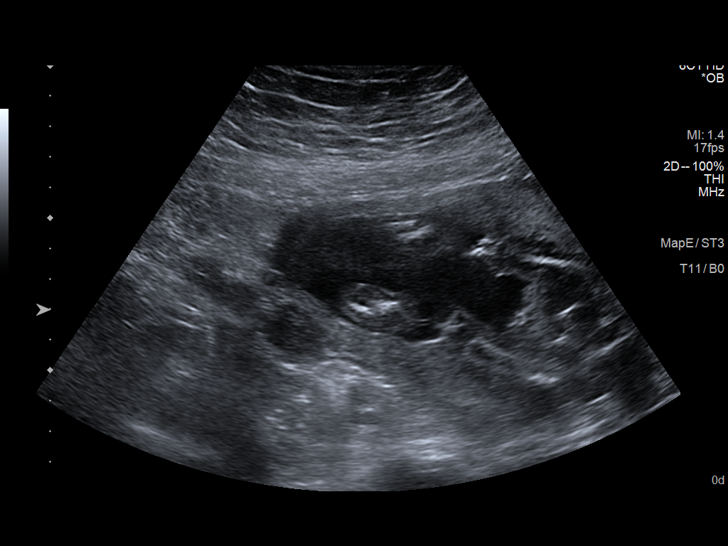
[im 19/24]
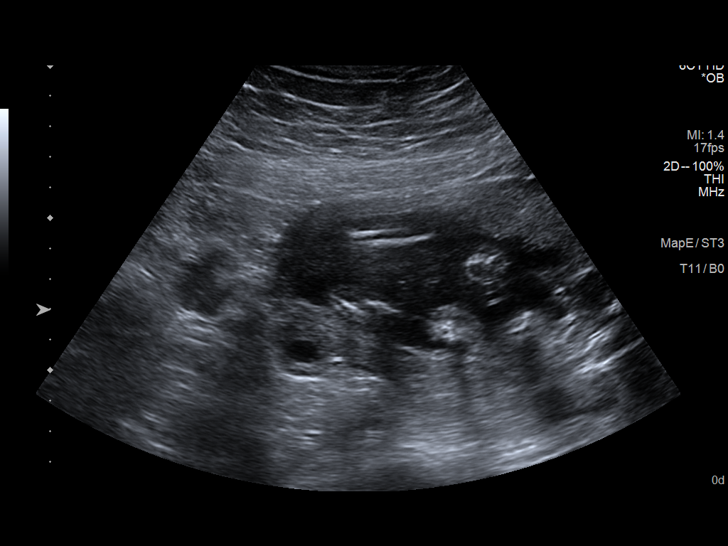
[im 20/24]
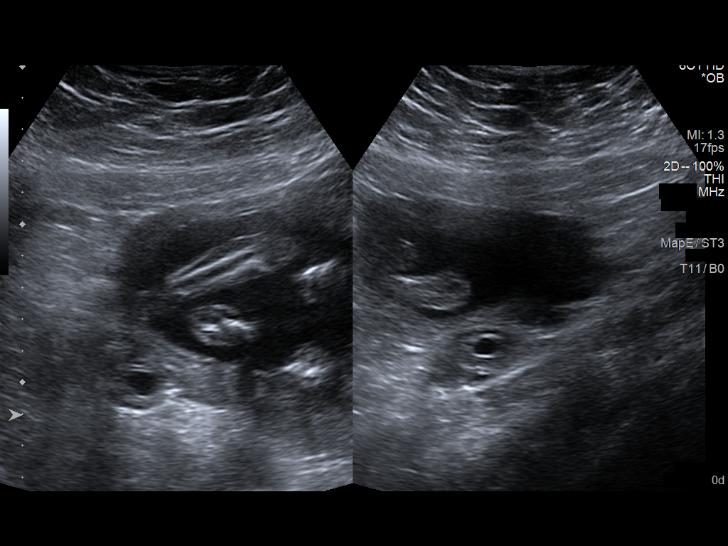
[im 22/24]
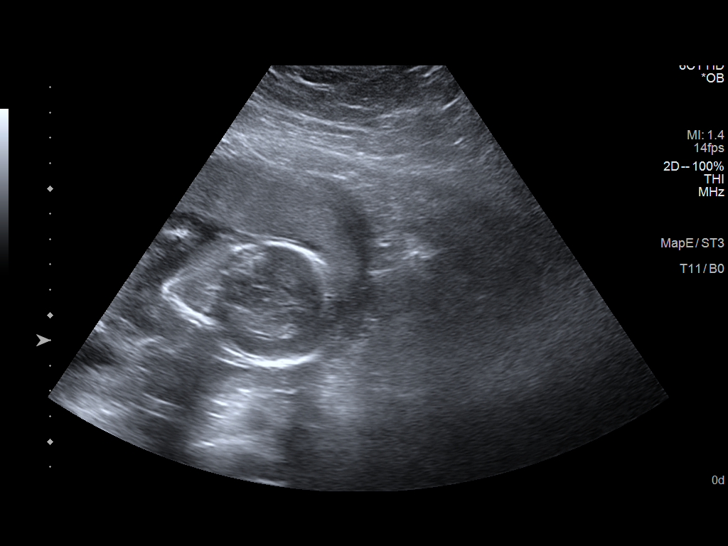
[im 24/24]
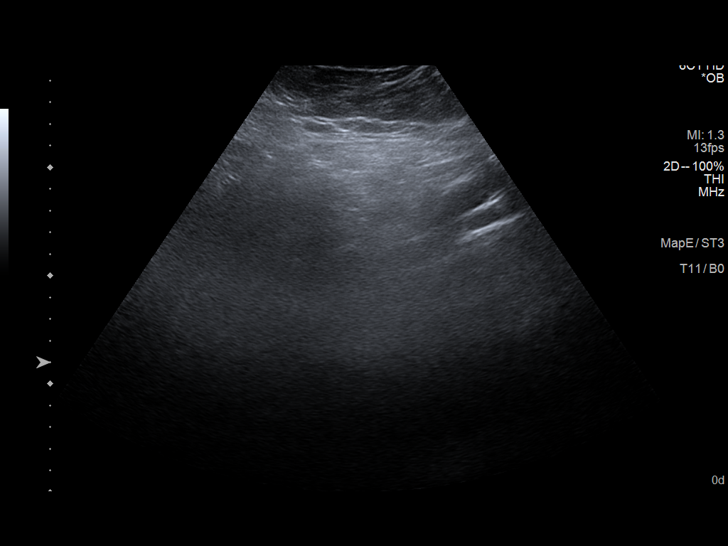

[14 of 24 positions shown; findings below may reference images not displayed]

FINDINGS: Number of Fetuses: 1

Heart Rate:  147 bpm

Movement: Present.

Presentation: Breech.

Placental Location: Anterior.

Previa: No.

Amniotic Fluid (Subjective):  Within normal limits.

BPD: 4.7 cm cm 20 w  2 d

MATERNAL FINDINGS:

Cervix:  Appears closed.  3.7 cm.

Uterus/Adnexae: No abnormality visualized. LEFT ovary not
sonographically identified.
IMPRESSION: Single live intrauterine pregnancy, gestational age by ultrasound 20
weeks and 2 days. No immediate complication, cervix closed.

This exam is performed on an emergent basis and does not
comprehensively evaluate fetal size, dating, or anatomy; follow-up
complete OB US should be considered if further fetal assessment is
warranted.

## 2019-03-23 MED ORDER — IVERMECTIN 3 MG PO TABS: 200.0000 ug/kg | ORAL_TABLET | Freq: Once | ORAL | 0 refills | Status: AC

## 2019-03-23 MED ORDER — TRIAMCINOLONE ACETONIDE 0.1 % EX CREA: 1.0000 "application " | TOPICAL_CREAM | Freq: Four times a day (QID) | CUTANEOUS | 0 refills | Status: AC

## 2019-03-23 MED ORDER — HYDROXYZINE PAMOATE 50 MG PO CAPS: 50.0000 mg | ORAL_CAPSULE | Freq: Three times a day (TID) | ORAL | 0 refills | Status: AC | PRN

## 2019-03-23 NOTE — ED Triage Notes (Signed)
Presents via ems from home  States she thinks that she has bed bugs  States she has been treating herself and her house for 2 months  On arrival she has several open sores   currntley on steroid taper and clindamycin

## 2019-03-23 NOTE — ED Provider Notes (Signed)
[facility] Emergency Department Provider Note  ____________________________________________  Time seen: Approximately 7:23 PM  I have reviewed the triage vital signs and the nursing notes.   HISTORY  Chief Complaint No chief complaint on file.    HPI Charlotte Stewart is a 33 y.o. female who presents the emergency department complaining of bedbug infestation.  Patient reports that a family member brought bedbugs to her house approximately 3 weeks ago.  Patient has been suffering from reported multiple bedbug bites.  Patient states that it is "driving me crazy."  Patient states that her primary care has treated her with prednisone as well as clindamycin with no improvement of symptoms.  Patient is insistent that she has bedbugs burrowing underneath her skin.  Patient is constantly picking at at her skin during the interview.  Patient repeatedly told me that "I am not crazy, I am not using meth or bath salts."  Patient patient has been taking her prescribed antibiotic and steroid as directed.  No other complaints at this time.         Past Medical History:  Diagnosis Date  . Hypertension     Patient Active Problem List   Diagnosis Date Noted  . Indication for care in labor and delivery, antepartum 06/11/2018  . Encounter for repeat ultrasound for low lying placenta, antepartum 06/11/2018    History reviewed. No pertinent surgical history.  Prior to Admission medications   Medication Sig Start Date End Date Taking? Authorizing Provider  clindamycin (CLEOCIN) 300 MG capsule Take 300 mg by mouth 3 (three) times daily.   Yes [provider]  prednisoLONE 5 MG (21) TBPK Take by mouth.   Yes [provider]  buprenorphine (SUBUTEX) 8 MG SUBL SL tablet Place under the tongue 2 (two) times daily.    [provider]  buPROPion (WELLBUTRIN) 100 MG tablet Take 100 mg by mouth once.    [provider]  hydrOXYzine (VISTARIL) 50 MG  capsule Take 1 capsule (50 mg total) by mouth 3 (three) times daily as needed for itching. 03/23/19   Kikue Gerhart, Delorise Royals, PA-C  ivermectin (STROMECTOL) 3 MG TABS tablet Take 8 tablets (24,000 mcg total) by mouth once for 1 dose. Take 8 tablets once, again in 2 weeks 03/23/19 03/23/19  Latarsha Zani, Delorise Royals, PA-C  ondansetron (ZOFRAN ODT) 4 MG disintegrating tablet Take 1 tablet (4 mg total) by mouth every 8 (eight) hours as needed for nausea or vomiting. 05/01/18   Emily Filbert, MD  sertraline (ZOLOFT) 50 MG tablet Take 50 mg by mouth daily.    [provider]  triamcinolone cream (KENALOG) 0.1 % Apply 1 application topically 4 (four) times daily. 03/23/19   Cameo Shewell, Delorise Royals, PA-C    Allergies Aspirin and Strawberry extract  No family history on file.  Social History Social History   Tobacco Use  . Smoking status: Current Every Day Smoker    Packs/day: 0.25  . Smokeless tobacco: Never Used  Substance Use Topics  . Alcohol use: Not Currently  . Drug use: Not Currently     Review of Systems  Constitutional: No fever/chills Eyes: No visual changes. No discharge ENT: No upper respiratory complaints. Cardiovascular: no chest pain. Respiratory: no cough. No SOB. Gastrointestinal: No abdominal pain.  No nausea, no vomiting.  No diarrhea.  No constipation. Genitourinary: Negative for dysuria. No hematuria Musculoskeletal: Negative for musculoskeletal pain. Skin: Multiple lesions scattered across the face, torso, extremities Neurological: Negative for headaches, focal weakness or numbness. 10-point  ROS otherwise negative.  ____________________________________________   PHYSICAL EXAM:  VITAL SIGNS: ED Triage Vitals  Enc Vitals Group     BP 03/23/19 1853 138/78     Pulse Rate 03/23/19 1853 78     Resp 03/23/19 1853 18     Temp 03/23/19 1853 98 F (36.7 C)     Temp Source 03/23/19 1853 Oral     SpO2 03/23/19 1853 98 %     Weight 03/23/19 1842 257 lb 15 oz  (117 kg)     Height --      Head Circumference --      Peak Flow --      Pain Score 03/23/19 1852 4     Pain Loc --      Pain Edu? --      Excl. in GC? --      Constitutional: Alert and oriented. Well appearing and in no acute distress. Eyes: Conjunctivae are normal. PERRL. EOMI. Head: Atraumatic. ENT:      Ears:       Nose: No congestion/rhinnorhea.      Mouth/Throat: Mucous membranes are moist.  Neck: No stridor.   Hematological/Lymphatic/Immunilogical: No cervical lymphadenopathy. Cardiovascular: Normal rate, regular rhythm. Normal S1 and S2.  Good peripheral circulation. Respiratory: Normal respiratory effort without tachypnea or retractions. Lungs CTAB. Good air entry to the bases with no decreased or absent breath sounds. Musculoskeletal: Full range of motion to all extremities. No gross deformities appreciated. Neurologic:  Normal speech and language. No gross focal neurologic deficits are appreciated.  Skin:  Skin is warm, dry and intact. No rash noted.  Patient with multiple lesions noted across the face, neck, torso, upper and lower extremities.  None of these have surrounding erythema or edema concerning for significant cellulitis or abscesses.  He is constantly picking at lesions throughout interview and exam.  Findings are consistent with either picking secondary to illicit substance versus bedbug bites.  A few lesions across the knuckles, interdigital space that are slightly linear.  No other significant skin findings. Psychiatric: Mood and affect are normal. Speech and behavior are normal. Patient exhibits appropriate insight and judgement.   ____________________________________________   LABS (all labs ordered are listed, but only abnormal results are displayed)  Labs Reviewed - No data to display ____________________________________________  EKG   ____________________________________________  RADIOLOGY   No results  found.  ____________________________________________    PROCEDURES  Procedure(s) performed:    Procedures    Medications - No data to display   ____________________________________________   INITIAL IMPRESSION / ASSESSMENT AND PLAN / ED COURSE  Pertinent labs & imaging results that were available during my care of the patient were reviewed by me and considered in my medical decision making (see chart for details).  Review of the [place] CSRS was performed in accordance of the [facility] prior to dispensing any controlled drugs.           Patient's diagnosis is consistent with bedbug infestation.  Patient presents to the emergency department complaining of bedbug bites.  She states that bedbugs were brought to her house by a family member.  Patient states that she has been treated with prednisone as well as clindamycin by her primary care.  Patient is very agitated stating that she is "not crazy, not on meth, not using bath salts."  Patient repeats this to me multiple times.  Patient is insistent that she has bed bugs burrowing underneath her skin.  Findings are consistent with either picking secondary to illicit  substance versus bedbug bites.  There are a few scattered lesions however in the interdigital space that are slightly linear in nature more consistent with scabies.  No other lesion is consistent with scabies however.  At this time I will prescribe triamcinolone for topical relief, hydroxyzine.  Because patient is insistent that she is invested in there are a few linear lesions concerning for scabies I will also treat with ivermectin.  Patient is instructed to wash her clothes, bed linens in the hottest water setting possible.  Back home items for a week if possible.  Patient is to scrub other furniture with furniture cleaners.  Patient verbalizes understanding of same..  Follow-up primary care.  Patient is given ED precautions to return to the ED for any worsening or new  symptoms.     ____________________________________________  FINAL CLINICAL IMPRESSION(S) / ED DIAGNOSES  Final diagnoses:  Infestation by bed bug      NEW MEDICATIONS STARTED DURING THIS VISIT:  ED Discharge Orders         Ordered    ivermectin (STROMECTOL) 3 MG TABS tablet   Once     03/23/19 1937    triamcinolone cream (KENALOG) 0.1 %  4 times daily     03/23/19 1937    hydrOXYzine (VISTARIL) 50 MG capsule  3 times daily PRN     03/23/19 1937              This chart was dictated using voice recognition software/Dragon. Despite best efforts to proofread, errors can occur which can change the meaning. Any change was purely unintentional.    Darletta Moll, PA-C 03/23/19 1943    Earleen Newport, MD 03/23/19 2037

## 2019-06-15 ENCOUNTER — Emergency Department: Payer: Medicaid Other

## 2019-06-15 ENCOUNTER — Encounter: Payer: Self-pay | Admitting: Emergency Medicine

## 2019-06-15 ENCOUNTER — Inpatient Hospital Stay
Admission: EM | Admit: 2019-06-15 | Discharge: 2019-06-20 | DRG: 872 | Discharge status: Against Medical Advice | Payer: Medicaid Other | Attending: Internal Medicine | Admitting: Internal Medicine

## 2019-06-15 ENCOUNTER — Other Ambulatory Visit: Payer: Self-pay

## 2019-06-15 DIAGNOSIS — L0291 Cutaneous abscess, unspecified: Secondary | ICD-10-CM

## 2019-06-15 DIAGNOSIS — E119 Type 2 diabetes mellitus without complications: Secondary | ICD-10-CM

## 2019-06-15 DIAGNOSIS — L02416 Cutaneous abscess of left lower limb: Secondary | ICD-10-CM | POA: Diagnosis present

## 2019-06-15 DIAGNOSIS — A419 Sepsis, unspecified organism: Secondary | ICD-10-CM

## 2019-06-15 DIAGNOSIS — F1910 Other psychoactive substance abuse, uncomplicated: Secondary | ICD-10-CM | POA: Diagnosis present

## 2019-06-15 DIAGNOSIS — L0390 Cellulitis, unspecified: Secondary | ICD-10-CM | POA: Diagnosis present

## 2019-06-15 DIAGNOSIS — F419 Anxiety disorder, unspecified: Secondary | ICD-10-CM | POA: Diagnosis present

## 2019-06-15 DIAGNOSIS — A4902 Methicillin resistant Staphylococcus aureus infection, unspecified site: Secondary | ICD-10-CM

## 2019-06-15 DIAGNOSIS — G8929 Other chronic pain: Secondary | ICD-10-CM

## 2019-06-15 DIAGNOSIS — I10 Essential (primary) hypertension: Secondary | ICD-10-CM | POA: Diagnosis present

## 2019-06-15 DIAGNOSIS — L0201 Cutaneous abscess of face: Secondary | ICD-10-CM

## 2019-06-15 DIAGNOSIS — F17210 Nicotine dependence, cigarettes, uncomplicated: Secondary | ICD-10-CM | POA: Diagnosis present

## 2019-06-15 DIAGNOSIS — Z720 Tobacco use: Secondary | ICD-10-CM

## 2019-06-15 DIAGNOSIS — Z79899 Other long term (current) drug therapy: Secondary | ICD-10-CM

## 2019-06-15 DIAGNOSIS — Z886 Allergy status to analgesic agent status: Secondary | ICD-10-CM

## 2019-06-15 DIAGNOSIS — Z6841 Body Mass Index (BMI) 40.0 and over, adult: Secondary | ICD-10-CM

## 2019-06-15 DIAGNOSIS — R1110 Vomiting, unspecified: Secondary | ICD-10-CM

## 2019-06-15 DIAGNOSIS — R112 Nausea with vomiting, unspecified: Secondary | ICD-10-CM | POA: Diagnosis not present

## 2019-06-15 DIAGNOSIS — B373 Candidiasis of vulva and vagina: Secondary | ICD-10-CM | POA: Diagnosis present

## 2019-06-15 DIAGNOSIS — B3731 Acute candidiasis of vulva and vagina: Secondary | ICD-10-CM

## 2019-06-15 DIAGNOSIS — I888 Other nonspecific lymphadenitis: Secondary | ICD-10-CM | POA: Diagnosis present

## 2019-06-15 DIAGNOSIS — Z20822 Contact with and (suspected) exposure to covid-19: Secondary | ICD-10-CM | POA: Diagnosis present

## 2019-06-15 DIAGNOSIS — J340 Abscess, furuncle and carbuncle of nose: Secondary | ICD-10-CM | POA: Diagnosis present

## 2019-06-15 DIAGNOSIS — Z7952 Long term (current) use of systemic steroids: Secondary | ICD-10-CM

## 2019-06-15 DIAGNOSIS — A4102 Sepsis due to Methicillin resistant Staphylococcus aureus: Principal | ICD-10-CM | POA: Diagnosis present

## 2019-06-15 DIAGNOSIS — R197 Diarrhea, unspecified: Secondary | ICD-10-CM | POA: Diagnosis not present

## 2019-06-15 DIAGNOSIS — E109 Type 1 diabetes mellitus without complications: Secondary | ICD-10-CM | POA: Diagnosis present

## 2019-06-15 DIAGNOSIS — F1120 Opioid dependence, uncomplicated: Secondary | ICD-10-CM | POA: Diagnosis present

## 2019-06-15 DIAGNOSIS — L03211 Cellulitis of face: Secondary | ICD-10-CM

## 2019-06-15 DIAGNOSIS — F32A Depression, unspecified: Secondary | ICD-10-CM

## 2019-06-15 DIAGNOSIS — K120 Recurrent oral aphthae: Secondary | ICD-10-CM | POA: Diagnosis present

## 2019-06-15 DIAGNOSIS — Z9102 Food additives allergy status: Secondary | ICD-10-CM

## 2019-06-15 DIAGNOSIS — F329 Major depressive disorder, single episode, unspecified: Secondary | ICD-10-CM | POA: Diagnosis present

## 2019-06-15 DIAGNOSIS — R7301 Impaired fasting glucose: Secondary | ICD-10-CM

## 2019-06-15 DIAGNOSIS — L0231 Cutaneous abscess of buttock: Secondary | ICD-10-CM | POA: Diagnosis present

## 2019-06-15 LAB — RESPIRATORY PANEL BY RT PCR (FLU A&B, COVID)
Influenza A by PCR: NEGATIVE
Influenza B by PCR: NEGATIVE
SARS Coronavirus 2 by RT PCR: NEGATIVE

## 2019-06-15 LAB — CBC WITH DIFFERENTIAL/PLATELET
Abs Immature Granulocytes: 0.09 10*3/uL — ABNORMAL HIGH (ref 0.00–0.07)
Basophils Absolute: 0 10*3/uL (ref 0.0–0.1)
Basophils Relative: 0 %
Eosinophils Absolute: 0.1 10*3/uL (ref 0.0–0.5)
Eosinophils Relative: 1 %
HCT: 38.7 % (ref 36.0–46.0)
Hemoglobin: 12.3 g/dL (ref 12.0–15.0)
Immature Granulocytes: 1 %
Lymphocytes Relative: 18 %
Lymphs Abs: 2.8 10*3/uL (ref 0.7–4.0)
MCH: 27.8 pg (ref 26.0–34.0)
MCHC: 31.8 g/dL (ref 30.0–36.0)
MCV: 87.6 fL (ref 80.0–100.0)
Monocytes Absolute: 0.6 10*3/uL (ref 0.1–1.0)
Monocytes Relative: 4 %
Neutro Abs: 12 10*3/uL — ABNORMAL HIGH (ref 1.7–7.7)
Neutrophils Relative %: 76 %
Platelets: 240 10*3/uL (ref 150–400)
RBC: 4.42 MIL/uL (ref 3.87–5.11)
RDW: 13.3 % (ref 11.5–15.5)
WBC: 15.6 10*3/uL — ABNORMAL HIGH (ref 4.0–10.5)
nRBC: 0 % (ref 0.0–0.2)

## 2019-06-15 LAB — COMPREHENSIVE METABOLIC PANEL
ALT: 31 U/L (ref 0–44)
AST: 20 U/L (ref 15–41)
Albumin: 3.2 g/dL — ABNORMAL LOW (ref 3.5–5.0)
Alkaline Phosphatase: 84 U/L (ref 38–126)
Anion gap: 13 (ref 5–15)
BUN: 9 mg/dL (ref 6–20)
CO2: 28 mmol/L (ref 22–32)
Calcium: 8.6 mg/dL — ABNORMAL LOW (ref 8.9–10.3)
Chloride: 96 mmol/L — ABNORMAL LOW (ref 98–111)
Creatinine, Ser: 0.49 mg/dL (ref 0.44–1.00)
GFR calc Af Amer: >60 mL/min (ref >60)
GFR calc non Af Amer: >60 mL/min (ref >60)
Glucose, Bld: 110 mg/dL — ABNORMAL HIGH (ref 70–99)
Potassium: 3.5 mmol/L (ref 3.5–5.1)
Sodium: 137 mmol/L (ref 135–145)
Total Bilirubin: 0.3 mg/dL (ref 0.3–1.2)
Total Protein: 6.8 g/dL (ref 6.5–8.1)

## 2019-06-15 LAB — URINALYSIS, COMPLETE (UACMP) WITH MICROSCOPIC
Bacteria, UA: NONE SEEN
Bilirubin Urine: NEGATIVE
Glucose, UA: NEGATIVE mg/dL
Hgb urine dipstick: NEGATIVE
Ketones, ur: NEGATIVE mg/dL
Nitrite: NEGATIVE
Protein, ur: NEGATIVE mg/dL
Specific Gravity, Urine: 1.018 (ref 1.005–1.030)
pH: 6 (ref 5.0–8.0)

## 2019-06-15 LAB — BLOOD GAS, VENOUS
Acid-Base Excess: 5.8 mmol/L — ABNORMAL HIGH (ref 0.0–2.0)
Bicarbonate: 32.5 mmol/L — ABNORMAL HIGH (ref 20.0–28.0)
O2 Saturation: 62.1 %
Patient temperature: 37
pCO2, Ven: 55 mmHg (ref 44.0–60.0)
pH, Ven: 7.38 (ref 7.250–7.430)
pO2, Ven: 33 mmHg (ref 32.0–45.0)

## 2019-06-15 LAB — LACTIC ACID, PLASMA
Lactic Acid, Venous: 1.2 mmol/L (ref 0.5–1.9)
Lactic Acid, Venous: 1.2 mmol/L (ref 0.5–1.9)

## 2019-06-15 LAB — MRSA PCR SCREENING: MRSA by PCR: POSITIVE — AB

## 2019-06-15 LAB — PREGNANCY, URINE: Preg Test, Ur: NEGATIVE

## 2019-06-15 MED ORDER — KETOROLAC TROMETHAMINE 30 MG/ML IJ SOLN
30.0000 mg | Freq: Once | INTRAMUSCULAR | Status: AC
  Administered 2019-06-15: 30 mg via INTRAVENOUS
  Filled 2019-06-15: qty 1

## 2019-06-15 MED ORDER — LIDOCAINE-EPINEPHRINE 2 %-1:100000 IJ SOLN
20.0000 mL | Freq: Once | INTRAMUSCULAR | Status: AC
  Administered 2019-06-15: 20 mL via INTRADERMAL
  Filled 2019-06-15: qty 1

## 2019-06-15 MED ORDER — VANCOMYCIN HCL IN DEXTROSE 1-5 GM/200ML-% IV SOLN
1000.0000 mg | Freq: Once | INTRAVENOUS | Status: AC
  Administered 2019-06-15: 1000 mg via INTRAVENOUS
  Filled 2019-06-15: qty 200

## 2019-06-15 MED ORDER — SODIUM CHLORIDE 0.9 % IV BOLUS
1000.0000 mL | Freq: Once | INTRAVENOUS | Status: AC
  Administered 2019-06-15: 1000 mL via INTRAVENOUS

## 2019-06-15 MED ORDER — IOHEXOL 300 MG/ML  SOLN
75.0000 mL | Freq: Once | INTRAMUSCULAR | Status: AC | PRN
  Administered 2019-06-15: 75 mL via INTRAVENOUS

## 2019-06-15 MED ORDER — SODIUM CHLORIDE 0.9 % IV SOLN
2.0000 g | Freq: Once | INTRAVENOUS | Status: AC
  Administered 2019-06-16: 2 g via INTRAVENOUS
  Filled 2019-06-15: qty 20

## 2019-06-15 MED ORDER — ACETAMINOPHEN 500 MG PO TABS
1000.0000 mg | ORAL_TABLET | Freq: Once | ORAL | Status: AC
  Administered 2019-06-15: 1000 mg via ORAL
  Filled 2019-06-15: qty 2

## 2019-06-15 NOTE — ED Notes (Signed)
Pt refuses rectal temp at this time.

## 2019-06-15 NOTE — ED Triage Notes (Addendum)
Pt arrived via POV with reports of multiple areas of abscesses, pt states she had recent infestation of bed bugs over the past few months, has large abscess in hairline on forehead.  Pt states the spots have worsened over the past month. Pt states she was seen by PCP and had been on prednisone.  Pt currently on Augmentin x 4 days.  Pt took temp at home was 103.1 took tylenol 2 hours ago.  Pt is a type 1 diabetic

## 2019-06-15 NOTE — ED Notes (Signed)
Pt given crackers and water. 

## 2019-06-15 NOTE — ED Notes (Signed)
IV access attempted x 1 unsuccesful, attempted x 1 to straight stick for blood unsuccessful.

## 2019-06-15 NOTE — ED Notes (Signed)
Pt provided with phone at this time.

## 2019-06-15 NOTE — Progress Notes (Signed)
PHARMACY -  BRIEF ANTIBIOTIC NOTE   Pharmacy has received consult(s) for Vancomycin from an ED provider.  The patient's profile has been reviewed for ht/wt/allergies/indication/available labs.    One time order(s) placed for Vancomycin 2gm total (1gm + 1gm)  Further antibiotics/pharmacy consults should be ordered by admitting physician if indicated.                       Thank you, Wayland Denis 06/15/2019  9:57 PM

## 2019-06-15 NOTE — ED Provider Notes (Signed)
[facility] Emergency Department Provider Note  ____________________________________________   First MD Initiated Contact with Patient 06/15/19 2056     (approximate)  I have reviewed the triage vital signs and the nursing notes.   HISTORY  Chief Complaint Recurrent Skin Infections    HPI Charlotte Stewart is a 34 y.o. female here with multiple areas of skin redness and swelling.  The patient has a history of IV drug use.  She also has a history of recent reported beetle/bedbug dermatitis.  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  She states that over the last week, she has been on Augmentin, and has had progressively worsening redness, pain, and now fever with swelling to the face, left leg, and multiple areas of her body.  She is a type I diabetic, and states that her blood sugars have been elevated.  She has had general fatigue, chills, and malaise.  She states the primary area of pain is her nasal bridge, and she has had some mild eye pain with this.  No nausea or vomiting.  No diarrhea.        Past Medical History:  Diagnosis Date  . Hypertension     Patient Active Problem List   Diagnosis Date Noted  . Indication for care in labor and delivery, antepartum 06/11/2018  . Encounter for repeat ultrasound for low lying placenta, antepartum 06/11/2018    History reviewed. No pertinent surgical history.  Prior to Admission medications   Medication Sig Start Date End Date Taking? Authorizing Provider  buprenorphine (SUBUTEX) 8 MG SUBL SL tablet Place under the tongue 2 (two) times daily.    [provider]  buPROPion (WELLBUTRIN) 100 MG tablet Take 100 mg by mouth once.    [provider]  clindamycin (CLEOCIN) 300 MG capsule Take 300 mg by mouth 3 (three) times daily.    [provider]  hydrOXYzine (VISTARIL) 50 MG capsule Take 1 capsule (50 mg total) by mouth 3 (three) times daily as needed for  itching. 03/23/19   Cuthriell, Delorise Royals, PA-C  ondansetron (ZOFRAN ODT) 4 MG disintegrating tablet Take 1 tablet (4 mg total) by mouth every 8 (eight) hours as needed for nausea or vomiting. 05/01/18   Emily Filbert, MD  prednisoLONE 5 MG (21) TBPK Take by mouth.    [provider]  sertraline (ZOLOFT) 50 MG tablet Take 50 mg by mouth daily.    [provider]  triamcinolone cream (KENALOG) 0.1 % Apply 1 application topically 4 (four) times daily. 03/23/19   Cuthriell, Delorise Royals, PA-C    Allergies Methylprednisolone, Aspirin, and Strawberry extract  History reviewed. No pertinent family history.  Social History Social History   Tobacco Use  . Smoking status: Current Every Day Smoker    Packs/day: 0.25  . Smokeless tobacco: Never Used  Substance Use Topics  . Alcohol use: Not Currently  . Drug use: Not Currently    Review of Systems  Review of Systems  Constitutional: Positive for fatigue. Negative for chills and fever.  HENT: Negative for sore throat.   Respiratory: Negative for shortness of breath.   Cardiovascular: Negative for chest pain.  Gastrointestinal: Negative for abdominal pain.  Genitourinary: Negative for flank pain.  Musculoskeletal: Negative for neck pain.  Skin: Positive for rash and wound.  Allergic/Immunologic: Negative for immunocompromised state.  Neurological: Positive for weakness. Negative for numbness.  Hematological: Does not bruise/bleed easily.  All other systems reviewed and are  negative.    ____________________________________________  PHYSICAL EXAM:      VITAL SIGNS: ED Triage Vitals  Enc Vitals Group     BP 06/15/19 2033 (!) 138/91     Pulse Rate 06/15/19 2033 (!) 117     Resp 06/15/19 2033 18     Temp 06/15/19 2033 99.9 F (37.7 C)     Temp Source 06/15/19 2033 Oral     SpO2 06/15/19 2033 100 %     Weight 06/15/19 2034 235 lb (106.6 kg)     Height 06/15/19 2034 5\' 2"  (1.575 m)     Head Circumference --       Peak Flow --      Pain Score 06/15/19 2034 10     Pain Loc --      Pain Edu? --      Excl. in GC? --      Physical Exam Vitals and nursing note reviewed.  Constitutional:      General: She is in acute distress.     Appearance: She is well-developed.  HENT:     Head: Normocephalic and atraumatic.     Mouth/Throat:     Mouth: Mucous membranes are dry.  Eyes:     Conjunctiva/sclera: Conjunctivae normal.  Cardiovascular:     Rate and Rhythm: Regular rhythm. Tachycardia present.     Heart sounds: Normal heart sounds.  Pulmonary:     Effort: Pulmonary effort is normal. No respiratory distress.     Breath sounds: No wheezing.  Abdominal:     General: There is no distension.  Musculoskeletal:     Cervical back: Neck supple.  Skin:    General: Skin is warm.     Capillary Refill: Capillary refill takes less than 2 seconds.     Findings: No rash.  Neurological:     Mental Status: She is alert and oriented to person, place, and time.     Motor: No abnormal muscle tone.       ____________________________________________   LABS (all labs ordered are listed, but only abnormal results are displayed)  Labs Reviewed  MRSA PCR SCREENING - Abnormal; Notable for the following components:      Result Value   MRSA by PCR POSITIVE (*)    All other components within normal limits  COMPREHENSIVE METABOLIC PANEL - Abnormal; Notable for the following components:   Chloride 96 (*)    Glucose, Bld 110 (*)    Calcium 8.6 (*)    Albumin 3.2 (*)    All other components within normal limits  CBC WITH DIFFERENTIAL/PLATELET - Abnormal; Notable for the following components:   WBC 15.6 (*)    Neutro Abs 12.0 (*)    Abs Immature Granulocytes 0.09 (*)    All other components within normal limits  URINALYSIS, COMPLETE (UACMP) WITH MICROSCOPIC - Abnormal; Notable for the following components:   Color, Urine YELLOW (*)    APPearance HAZY (*)    Leukocytes,Ua TRACE (*)    All other components  within normal limits  BLOOD GAS, VENOUS - Abnormal; Notable for the following components:   Bicarbonate 32.5 (*)    Acid-Base Excess 5.8 (*)    All other components within normal limits  RESPIRATORY PANEL BY RT PCR (FLU A&B, COVID)  CULTURE, BLOOD (ROUTINE X 2)  CULTURE, BLOOD (ROUTINE X 2)  LACTIC ACID, PLASMA  LACTIC ACID, PLASMA  PREGNANCY, URINE    ____________________________________________  EKG: None ________________________________________  RADIOLOGY All imaging, including plain films, CT  scans, and ultrasounds, independently reviewed by me, and interpretations confirmed via formal radiology reads.  ED MD interpretation:   CT Face/Sinuses: multifocal areas of infection, possible hypodensity at nasal bridge suspicious for small early abscess  Official radiology report(s): CT Maxillofacial W Contrast  Result Date: 06/15/2019 CLINICAL DATA:  Initial evaluation for acute infection, facial cellulitis. EXAM: CT MAXILLOFACIAL WITH CONTRAST TECHNIQUE: Multidetector CT imaging of the maxillofacial structures was performed with intravenous contrast. Multiplanar CT image reconstructions were also generated. CONTRAST:  7mL OMNIPAQUE IOHEXOL 300 MG/ML  SOLN COMPARISON:  None. FINDINGS: Osseous: No acute osseous abnormality. No discrete lytic or blastic osseous lesions. Few scattered dental caries/periapical lucencies noted about the teeth. No associated inflammatory changes. Orbits: Globes and orbital soft tissues within normal limits. No evidence for intraorbital or postseptal cellulitis. Sinuses: Mild scattered mucosal thickening noted within the ethmoidal air cells, sphenoid sinuses, and maxillary sinuses. Few scattered superimposed pneumatized secretions noted at the left sphenoid and maxillary sinus. Mastoid air cells and middle ear cavities are well pneumatized and free of fluid. Soft tissues: Multifocal areas of soft tissue swelling seen about the central and right forehead (series 2,  images 8, 13), concerning for acute infection/cellulitis areas involved demonstrate mild phlegmonous change without discrete abscess about the forehead. Additional focal area of swelling and inflammatory stranding seen at the right nasal bridge/medial canthus (series 2, image 50), also concerning for infection. 6 mm rim enhancing hypodensity within this region suspicious for a small early abscess (series 2, image 46). Again, no evidence for associated postseptal or intraorbital extension at this time. An additional area of focal swelling and phlegmonous change seen at the left temporal scalp as well (series 2, image 35). No abscess within this region. Additional mild inflammatory stranding noted overlying the right zygomatic region without discrete collection. Few scattered well-circumscribed soft tissue lesion seen within the parotid glands bilaterally. There are 2 lesions on the right, largest of which measures 10 x 12 x 16 mm (series 2, image 69). Two lesions seen on the left, largest of which measures 10 x 8 x 12 mm (series 2, image 71). Findings are indeterminate. 12 x 11 mm (series 2, image 69). Single lesion Limited intracranial: Visualized intracranial contents demonstrate no acute finding. No evidence for intracranial extension of infection at this time. IMPRESSION: 1. Multifocal areas of soft tissue swelling and phlegmonous change about the forehead, right nasal bridge, right zygomatic region, and left temporal scalp as above, concerning for acute infection/cellulitis. 6 mm rim enhancing hypodensity at the right nasal bridge suspicious for a small early abscess. No evidence for postseptal or intracranial extension of infection at this time. 2. Few scattered dental caries/periapical lucencies about the teeth without associated inflammation. 3. Few scattered well-circumscribed soft tissue lesions within the bilateral parotid glands as above, indeterminate. Outpatient ENT referral for further workup and  consultation suggested. 4. Mild paranasal sinus disease as above. Electronically Signed   By: Rise Mu M.D.   On: 06/15/2019 23:58    ____________________________________________  PROCEDURES   Procedure(s) performed (including Critical Care):  Marland KitchenMarland KitchenIncision and Drainage  Date/Time: 06/16/2019 12:19 AM Performed by: Shaune Pollack, MD Authorized by: Shaune Pollack, MD   Consent:    Consent obtained:  Verbal   Consent given by:  Patient   Risks discussed:  Bleeding, damage to other organs, incomplete drainage, infection and pain   Alternatives discussed:  Alternative treatment and delayed treatment Location:    Type:  Abscess   Size:  2 x 1  Location:  Lower extremity   Lower extremity location:  Leg   Leg location:  L upper leg Pre-procedure details:    Skin preparation:  Betadine Anesthesia (see MAR for exact dosages):    Anesthesia method:  Local infiltration   Local anesthetic:  Lidocaine 1% WITH epi Procedure type:    Complexity:  Simple Procedure details:    Incision types:  Single straight   Incision depth:  Dermal   Scalpel blade:  11   Wound management:  Probed and deloculated and irrigated with saline Post-procedure details:    Patient tolerance of procedure:  Tolerated well, no immediate complications .Critical Care Performed by: Shaune Pollack, MD Authorized by: Shaune Pollack, MD   Critical care provider statement:    Critical care time (minutes):  35   Critical care time was exclusive of:  Separately billable procedures and treating other patients and teaching time   Critical care was necessary to treat or prevent imminent or life-threatening deterioration of the following conditions:  Cardiac failure, circulatory failure and sepsis   Critical care was time spent personally by me on the following activities:  Development of treatment plan with patient or surrogate, discussions with consultants, evaluation of patient's response to treatment,  examination of patient, obtaining history from patient or surrogate, ordering and performing treatments and interventions, ordering and review of laboratory studies, ordering and review of radiographic studies, pulse oximetry, re-evaluation of patient's condition and review of old charts   I assumed direction of critical care for this patient from another provider in my specialty: no      ____________________________________________  INITIAL IMPRESSION / MDM / ASSESSMENT AND PLAN / ED COURSE  As part of my medical decision making, I reviewed the following data within the electronic MEDICAL RECORD NUMBER Nursing notes reviewed and incorporated, Old chart reviewed, Notes from prior ED visits, and [place] Controlled Substance Database       *Charlotte Stewart was evaluated in Emergency Department on 06/16/2019 for the symptoms described in the history of present illness. She was evaluated in the context of the global COVID-19 pandemic, which necessitated consideration that the patient might be at risk for infection with the SARS-CoV-2 virus that causes COVID-19. Institutional protocols and algorithms that pertain to the evaluation of patients at risk for COVID-19 are in a state of rapid change based on information released by regulatory bodies including the CDC and federal and state organizations. These policies and algorithms were followed during the patient's care in the ED.  Some ED evaluations and interventions may be delayed as a result of limited staffing during the pandemic.*  Clinical Course as of Jun 16 18  Tue Jun 15, 2019  2355 34 yo F here with multiple, innumerable areas of cellulitis and small subcutaneous abscesses. Pt also with large area of cellulitis to the nasal bridge and forehead, with marked induration. No apparent extension to orbit. Will admit for sepsis 2/2 likely MRSA cellulitis. Pt is not amenable to I&D of most abscesses at this time.   [CI]    Clinical Course User Index [CI]  Shaune Pollack, MD    Medical Decision Making: 34 year old female with history of diabetes here with fever, multiple areas of redness.  Exam is consistent with likely multiple areas of MRSA folliculitis and cellulitis.  She has a fairly significant facial cellulitis extending to the outer aspect of the nasal bridge, but I do not see apparent extension into the orbit.  CT of the face shows no evidence of orbital  extension.  She is not amenable to incision and drainage of these in the ED.  Given the extent of her multiple areas, feel is reasonable to start on IV vancomycin, admit, and monitor.  She may ultimately need drainage, but at this time given the severity and number of her areas, this would require general anesthesia.  I did drain the largest of the fluid collections along the left medial buttocks, with return of purulent material.  ____________________________________________  FINAL CLINICAL IMPRESSION(S) / ED DIAGNOSES  Final diagnoses:  Sepsis due to cellulitis ([place])  Facial cellulitis  MRSA (methicillin resistant Staphylococcus aureus)     MEDICATIONS GIVEN DURING THIS VISIT:  Medications  vancomycin (VANCOCIN) IVPB 1000 mg/200 mL premix (0 mg Intravenous Stopped 06/15/19 2337)    Followed by  vancomycin (VANCOCIN) IVPB 1000 mg/200 mL premix (1,000 mg Intravenous New Bag/Given 06/15/19 2340)  cefTRIAXone (ROCEPHIN) 2 g in sodium chloride 0.9 % 100 mL IVPB (has no administration in time range)  sodium chloride 0.9 % bolus 1,000 mL (1,000 mLs Intravenous New Bag/Given 06/15/19 2134)  lidocaine-EPINEPHrine (XYLOCAINE W/EPI) 2 %-1:100000 (with pres) injection 20 mL (20 mLs Intradermal Given by Other 06/15/19 2341)  ketorolac (TORADOL) 30 MG/ML injection 30 mg (30 mg Intravenous Given 06/15/19 2334)  acetaminophen (TYLENOL) tablet 1,000 mg (1,000 mg Oral Given 06/15/19 2333)  iohexol (OMNIPAQUE) 300 MG/ML solution 75 mL (75 mLs Intravenous Contrast Given 06/15/19 2310)     ED Discharge Orders     None       Note:  This document was prepared using Dragon voice recognition software and may include unintentional dictation errors.   Duffy Bruce, MD 06/16/19 [phone]

## 2019-06-15 NOTE — ED Notes (Signed)
Suture cart at bedside at this time.  

## 2019-06-15 NOTE — ED Notes (Signed)
Dr. Erma Heritage in room to I&D abscess to posterior left upper leg.

## 2019-06-16 ENCOUNTER — Encounter: Payer: Self-pay | Admitting: Family Medicine

## 2019-06-16 ENCOUNTER — Other Ambulatory Visit: Payer: Self-pay

## 2019-06-16 DIAGNOSIS — F1910 Other psychoactive substance abuse, uncomplicated: Secondary | ICD-10-CM

## 2019-06-16 DIAGNOSIS — L0291 Cutaneous abscess, unspecified: Secondary | ICD-10-CM

## 2019-06-16 DIAGNOSIS — Z20822 Contact with and (suspected) exposure to covid-19: Secondary | ICD-10-CM | POA: Diagnosis present

## 2019-06-16 DIAGNOSIS — J340 Abscess, furuncle and carbuncle of nose: Secondary | ICD-10-CM | POA: Diagnosis present

## 2019-06-16 DIAGNOSIS — F32A Depression, unspecified: Secondary | ICD-10-CM

## 2019-06-16 DIAGNOSIS — A419 Sepsis, unspecified organism: Secondary | ICD-10-CM

## 2019-06-16 DIAGNOSIS — Z9102 Food additives allergy status: Secondary | ICD-10-CM | POA: Diagnosis not present

## 2019-06-16 DIAGNOSIS — F329 Major depressive disorder, single episode, unspecified: Secondary | ICD-10-CM | POA: Diagnosis not present

## 2019-06-16 DIAGNOSIS — Z6841 Body Mass Index (BMI) 40.0 and over, adult: Secondary | ICD-10-CM | POA: Diagnosis not present

## 2019-06-16 DIAGNOSIS — L03211 Cellulitis of face: Secondary | ICD-10-CM | POA: Diagnosis present

## 2019-06-16 DIAGNOSIS — A4102 Sepsis due to Methicillin resistant Staphylococcus aureus: Secondary | ICD-10-CM | POA: Diagnosis present

## 2019-06-16 DIAGNOSIS — Z886 Allergy status to analgesic agent status: Secondary | ICD-10-CM | POA: Diagnosis not present

## 2019-06-16 DIAGNOSIS — Z794 Long term (current) use of insulin: Secondary | ICD-10-CM

## 2019-06-16 DIAGNOSIS — L03818 Cellulitis of other sites: Secondary | ICD-10-CM | POA: Diagnosis not present

## 2019-06-16 DIAGNOSIS — F419 Anxiety disorder, unspecified: Secondary | ICD-10-CM | POA: Diagnosis present

## 2019-06-16 DIAGNOSIS — L0231 Cutaneous abscess of buttock: Secondary | ICD-10-CM

## 2019-06-16 DIAGNOSIS — K120 Recurrent oral aphthae: Secondary | ICD-10-CM | POA: Diagnosis present

## 2019-06-16 DIAGNOSIS — L0390 Cellulitis, unspecified: Secondary | ICD-10-CM

## 2019-06-16 DIAGNOSIS — E66813 Obesity, class 3: Secondary | ICD-10-CM

## 2019-06-16 DIAGNOSIS — B373 Candidiasis of vulva and vagina: Secondary | ICD-10-CM | POA: Diagnosis present

## 2019-06-16 DIAGNOSIS — G8929 Other chronic pain: Secondary | ICD-10-CM | POA: Diagnosis present

## 2019-06-16 DIAGNOSIS — L0201 Cutaneous abscess of face: Secondary | ICD-10-CM | POA: Diagnosis present

## 2019-06-16 DIAGNOSIS — I10 Essential (primary) hypertension: Secondary | ICD-10-CM | POA: Diagnosis present

## 2019-06-16 DIAGNOSIS — E109 Type 1 diabetes mellitus without complications: Secondary | ICD-10-CM | POA: Diagnosis present

## 2019-06-16 DIAGNOSIS — Z720 Tobacco use: Secondary | ICD-10-CM

## 2019-06-16 DIAGNOSIS — R7301 Impaired fasting glucose: Secondary | ICD-10-CM

## 2019-06-16 DIAGNOSIS — E119 Type 2 diabetes mellitus without complications: Secondary | ICD-10-CM

## 2019-06-16 DIAGNOSIS — F1120 Opioid dependence, uncomplicated: Secondary | ICD-10-CM | POA: Diagnosis present

## 2019-06-16 DIAGNOSIS — R197 Diarrhea, unspecified: Secondary | ICD-10-CM | POA: Diagnosis not present

## 2019-06-16 DIAGNOSIS — F17210 Nicotine dependence, cigarettes, uncomplicated: Secondary | ICD-10-CM | POA: Diagnosis present

## 2019-06-16 DIAGNOSIS — L02416 Cutaneous abscess of left lower limb: Secondary | ICD-10-CM | POA: Diagnosis present

## 2019-06-16 DIAGNOSIS — R112 Nausea with vomiting, unspecified: Secondary | ICD-10-CM | POA: Diagnosis not present

## 2019-06-16 DIAGNOSIS — I888 Other nonspecific lymphadenitis: Secondary | ICD-10-CM | POA: Diagnosis present

## 2019-06-16 LAB — BASIC METABOLIC PANEL
Anion gap: 10 (ref 5–15)
BUN: 10 mg/dL (ref 6–20)
CO2: 26 mmol/L (ref 22–32)
Calcium: 8.4 mg/dL — ABNORMAL LOW (ref 8.9–10.3)
Chloride: 103 mmol/L (ref 98–111)
Creatinine, Ser: 0.47 mg/dL (ref 0.44–1.00)
GFR calc Af Amer: >60 mL/min (ref >60)
GFR calc non Af Amer: >60 mL/min (ref >60)
Glucose, Bld: 150 mg/dL — ABNORMAL HIGH (ref 70–99)
Potassium: 4 mmol/L (ref 3.5–5.1)
Sodium: 139 mmol/L (ref 135–145)

## 2019-06-16 LAB — CBC
HCT: 37.4 % (ref 36.0–46.0)
Hemoglobin: 11.9 g/dL — ABNORMAL LOW (ref 12.0–15.0)
MCH: 28.2 pg (ref 26.0–34.0)
MCHC: 31.8 g/dL (ref 30.0–36.0)
MCV: 88.6 fL (ref 80.0–100.0)
Platelets: 214 10*3/uL (ref 150–400)
RBC: 4.22 MIL/uL (ref 3.87–5.11)
RDW: 13.3 % (ref 11.5–15.5)
WBC: 10.4 10*3/uL (ref 4.0–10.5)
nRBC: 0 % (ref 0.0–0.2)

## 2019-06-16 LAB — GLUCOSE, CAPILLARY
Glucose-Capillary: 172 mg/dL — ABNORMAL HIGH (ref 70–99)
Glucose-Capillary: 119 mg/dL — ABNORMAL HIGH (ref 70–99)
Glucose-Capillary: 137 mg/dL — ABNORMAL HIGH (ref 70–99)

## 2019-06-16 LAB — HEMOGLOBIN A1C
Hgb A1c MFr Bld: 5.6 % (ref 4.8–5.6)
Hgb A1c MFr Bld: 5.6 % (ref 4.8–5.6)
Mean Plasma Glucose: 114.02 mg/dL
Mean Plasma Glucose: 114.02 mg/dL

## 2019-06-16 LAB — HIV ANTIBODY (ROUTINE TESTING W REFLEX): HIV Screen 4th Generation wRfx: NONREACTIVE

## 2019-06-16 MED ORDER — VANCOMYCIN HCL IN DEXTROSE 1-5 GM/200ML-% IV SOLN
1000.0000 mg | Freq: Two times a day (BID) | INTRAVENOUS | Status: DC
  Administered 2019-06-16 – 2019-06-18 (×5): 1000 mg via INTRAVENOUS
  Filled 2019-06-16 (×7): qty 200

## 2019-06-16 MED ORDER — SODIUM CHLORIDE 0.9 % IV SOLN
2.0000 g | INTRAVENOUS | Status: DC
  Filled 2019-06-16: qty 20

## 2019-06-16 MED ORDER — ACETAMINOPHEN 325 MG PO TABS
650.0000 mg | ORAL_TABLET | Freq: Four times a day (QID) | ORAL | Status: DC | PRN
  Administered 2019-06-16 – 2019-06-18 (×4): 650 mg via ORAL
  Filled 2019-06-16 (×4): qty 2

## 2019-06-16 MED ORDER — ROPINIROLE HCL 0.25 MG PO TABS
0.2500 mg | ORAL_TABLET | Freq: Every day | ORAL | Status: DC
  Administered 2019-06-16 – 2019-06-19 (×5): 0.25 mg via ORAL
  Filled 2019-06-16 (×6): qty 1

## 2019-06-16 MED ORDER — IBUPROFEN 400 MG PO TABS
600.0000 mg | ORAL_TABLET | Freq: Three times a day (TID) | ORAL | Status: DC | PRN
  Administered 2019-06-17 – 2019-06-18 (×3): 600 mg via ORAL
  Filled 2019-06-16 (×3): qty 2

## 2019-06-16 MED ORDER — KETOROLAC TROMETHAMINE 15 MG/ML IJ SOLN
15.0000 mg | Freq: Once | INTRAMUSCULAR | Status: AC
  Administered 2019-06-16: 15 mg via INTRAVENOUS
  Filled 2019-06-16: qty 1

## 2019-06-16 MED ORDER — GABAPENTIN 600 MG PO TABS
1200.0000 mg | ORAL_TABLET | Freq: Two times a day (BID) | ORAL | Status: DC
  Administered 2019-06-16 – 2019-06-20 (×10): 1200 mg via ORAL
  Filled 2019-06-16 (×10): qty 2

## 2019-06-16 MED ORDER — MIRTAZAPINE 15 MG PO TABS
45.0000 mg | ORAL_TABLET | Freq: Every day | ORAL | Status: DC
  Administered 2019-06-16 – 2019-06-18 (×4): 45 mg via ORAL
  Filled 2019-06-16 (×5): qty 3

## 2019-06-16 MED ORDER — NICOTINE 21 MG/24HR TD PT24
21.0000 mg | MEDICATED_PATCH | Freq: Every day | TRANSDERMAL | Status: DC
  Administered 2019-06-16 – 2019-06-19 (×4): 21 mg via TRANSDERMAL
  Filled 2019-06-16 (×4): qty 1

## 2019-06-16 MED ORDER — BUPRENORPHINE HCL 2 MG SL SUBL
8.0000 mg | SUBLINGUAL_TABLET | Freq: Two times a day (BID) | SUBLINGUAL | Status: DC
  Administered 2019-06-16 – 2019-06-17 (×2): 8 mg via SUBLINGUAL
  Filled 2019-06-16 (×3): qty 4

## 2019-06-16 MED ORDER — GENTAMICIN SULFATE 0.1 % EX OINT
TOPICAL_OINTMENT | Freq: Three times a day (TID) | CUTANEOUS | Status: DC
  Filled 2019-06-16 (×3): qty 15

## 2019-06-16 MED ORDER — SODIUM CHLORIDE 0.9 % IV SOLN
3.0000 g | Freq: Four times a day (QID) | INTRAVENOUS | Status: DC
  Administered 2019-06-16 – 2019-06-19 (×14): 3 g via INTRAVENOUS
  Filled 2019-06-16 (×4): qty 8
  Filled 2019-06-16: qty 3
  Filled 2019-06-16 (×6): qty 8
  Filled 2019-06-16 (×4): qty 3
  Filled 2019-06-16: qty 8

## 2019-06-16 MED ORDER — BUPRENORPHINE HCL-NALOXONE HCL 8-2 MG SL SUBL
1.0000 | SUBLINGUAL_TABLET | Freq: Two times a day (BID) | SUBLINGUAL | Status: DC
  Filled 2019-06-16: qty 1

## 2019-06-16 MED ORDER — BUPROPION HCL 100 MG PO TABS
100.0000 mg | ORAL_TABLET | Freq: Every day | ORAL | Status: DC
  Administered 2019-06-16 – 2019-06-19 (×4): 100 mg via ORAL
  Filled 2019-06-16 (×6): qty 1

## 2019-06-16 MED ORDER — INSULIN ASPART 100 UNIT/ML ~~LOC~~ SOLN
0.0000 [IU] | Freq: Three times a day (TID) | SUBCUTANEOUS | Status: DC
  Administered 2019-06-16: 2 [IU] via SUBCUTANEOUS
  Filled 2019-06-16: qty 1

## 2019-06-16 NOTE — Progress Notes (Signed)
Pharmacy Antibiotic Note  Charlotte Stewart is a 34 y.o. female admitted on 06/15/2019 with cellulitis.  Pharmacy has been consulted for Vancomycin dosing.  Pt received loading dose of 2gm in ED  Plan: Vancomycin 1000 mg IV Q 12 hrs. Goal AUC 400-550. Expected AUC: 510.6, estimated trough ~ 14.5 SCr used: 0.8   Height: 5\' 2"  (157.5 cm) Weight: 242 lb 4.6 oz (109.9 kg) IBW/kg (Calculated) : 50.1  Temp (24hrs), Avg:98.9 F (37.2 C), Min:98.2 F (36.8 C), Max:99.9 F (37.7 C)  Recent Labs  Lab 06/15/19 2127  WBC 15.6*  CREATININE 0.49  LATICACIDVEN 1.2  1.2    Estimated Creatinine Clearance: 119 mL/min (by C-G formula based on SCr of 0.49 mg/dL).    Allergies  Allergen Reactions  . Methylprednisolone Swelling  . Aspirin Other (See Comments)    Nose bleeds  . Naloxone Swelling and Other (See Comments)    Throat swelling   . Strawberry Extract     Antimicrobials this admission: Vancomycin 3/2 >>  Rocephin 3/3 >>  Dose adjustments this admission:   Microbiology results:  BCx:   UCx:    Sputum:    MRSA PCR:   Thank you for allowing pharmacy to be a part of this patient's care.  2128 A 06/16/2019 3:21 AM

## 2019-06-16 NOTE — H&P (Signed)
History and Physical    Charlotte Stewart HLK:[id number] DOB: 12-07-1985 DOA: 06/15/2019  PCP: [facility]  Patient coming from: Home  I have personally briefly reviewed patient's old medical records in [facility]  Chief Complaint: Abscesses  HPI: Charlotte Stewart is a 34 y.o. female with medical history significant for gestational diabetes now requiring long-term insulin, opioid dependence, history of polysubstance abuse, and depression who presents with concerns of scattered abscess with new fever and chills.  Patient reports back in October she was diagnosed with carpopedal dermatitis by a dermatologist that resolved when she had her house treated.  Then about a month ago she began to notice lesions on her hand that progressively worsened a week ago where she noticed a large abscess on her left posterior thigh.  This then progressed everywhere throughout her body and onto her face and forehead.  Abscesses are painful and not pruritic.  She was seen in urgent care about 4 days ago and was placed on steroids and Augmentin but felt it made her abscess worse.  She became more concerned because today she had nausea vomiting chills and fever at home.  Patient denies any previous history of wide spreading abscess.  States that she is not currently on any other drugs and did not want to tell me the reason why she is taking Suboxone. She endorses a pack a day of tobacco use and request for nicotine patch.  Denies alcohol use.  ED Course: She had temperature of 99.9 tachycardic up to 117 and normotensive on room air. WBC of 15.6.  Glucose of 110.  Lactic acid of 1.2. Negative pregnancy test. Negative PCR influenza and Covid test.  Negative UA.  CT facial maxillary was obtained that showed multiple areas of soft tissue swelling and phlegmonous changes on the face but there is no evidence of post septal or intracranial extension of infection.  ED physician performed I &D on her largest  abscess to posterior left thigh.  She was started on vancomycin and Rocephin in the ED.  Review of Systems:  Constitutional: No Weight Change, + Fever ENT/Mouth: No sore throat, No Rhinorrhea Eyes: No Eye Pain, No Vision Changes Cardiovascular: No Chest Pain, no SOB Respiratory: No Cough, No Sputum  Gastrointestinal: No Nausea, + Vomiting Genitourinary: no Urinary Incontinence,  Musculoskeletal: No Arthralgias, No Myalgias Skin: +skin Lesions, No Pruritus, Neuro: no Weakness, No Numbness,   Psych: No Anxiety/Panic, No Depression, no decrease appetite Heme/Lymph: No Bruising, No Bleeding  Past Medical History:  Diagnosis Date  . Hypertension     History reviewed. No pertinent surgical history.   reports that she has been smoking. She has been smoking about 0.25 packs per day. She has never used smokeless tobacco. She reports previous alcohol use. She reports previous drug use.  Allergies  Allergen Reactions  . Methylprednisolone Swelling  . Aspirin Other (See Comments)    Nose bleeds  . Strawberry Extract       Prior to Admission medications   Medication Sig Start Date End Date Taking? Authorizing Provider  buprenorphine (SUBUTEX) 8 MG SUBL SL tablet Place under the tongue 2 (two) times daily.    [provider]  buPROPion (WELLBUTRIN) 100 MG tablet Take 100 mg by mouth once.    [provider]  clindamycin (CLEOCIN) 300 MG capsule Take 300 mg by mouth 3 (three) times daily.    [provider]  hydrOXYzine (VISTARIL) 50 MG capsule Take 1 capsule (50 mg total) by mouth 3 (  three) times daily as needed for itching. 03/23/19   Cuthriell, Charline Bills, PA-C  ondansetron (ZOFRAN ODT) 4 MG disintegrating tablet Take 1 tablet (4 mg total) by mouth every 8 (eight) hours as needed for nausea or vomiting. 05/01/18   Earleen Newport, MD  prednisoLONE 5 MG (21) TBPK Take by mouth.    [provider]  sertraline (ZOLOFT) 50 MG tablet Take 50 mg by  mouth daily.    [provider]  triamcinolone cream (KENALOG) 0.1 % Apply 1 application topically 4 (four) times daily. 03/23/19   Darletta Moll, PA-C    Physical Exam: Vitals:   06/15/19 2033 06/15/19 2034 06/15/19 2339 06/15/19 2347  BP: (!) 138/91   130/80  Pulse: (!) 117   (!) 106  Resp: 18   18  Temp: 99.9 F (37.7 C)  98.7 F (37.1 C)   TempSrc: Oral  Oral   SpO2: 100%   100%  Weight:  106.6 kg    Height:  5\' 2"  (1.575 m)      Constitutional: NAD, calm, comfortable, morbidly obese female laying flat in bed Vitals:   06/15/19 2033 06/15/19 2034 06/15/19 2339 06/15/19 2347  BP: (!) 138/91   130/80  Pulse: (!) 117   (!) 106  Resp: 18   18  Temp: 99.9 F (37.7 C)  98.7 F (37.1 C)   TempSrc: Oral  Oral   SpO2: 100%   100%  Weight:  106.6 kg    Height:  5\' 2"  (1.575 m)     Eyes: PERRL, lids and conjunctivae normal ENMT: Mucous membranes are moist. Neck: normal, supple Respiratory: clear to auscultation bilaterally, no wheezing, no crackles. Normal respiratory effort on room air. No accessory muscle use.  Cardiovascular: Regular rate and rhythm, no murmurs / rubs / gallops. No extremity edema.  Abdomen: no tenderness, no masses palpated. Bowel sounds positive.  Musculoskeletal: no clubbing / cyanosis. No joint deformity upper and lower extremities. Good ROM, no contractures. Normal muscle tone.  Skin: large erythematous indurated lesion on hairline of anterior forehead, right nasal bridge, scattered small erythematous lesions with central white head on abdomen, upper extremity and posterior LE.  Small laceration on left posterior thigh from I &D covered with blood soaked bandage.  Neurologic: CN 2-12 grossly intact. Sensation intact. Strength 5/5 in all 4.  Psychiatric: Normal judgment and insight. Alert and oriented x 3. Normal mood.    Labs on Admission: I have personally reviewed following labs and imaging studies  CBC: Recent Labs  Lab  06/15/19 2127  WBC 15.6*  NEUTROABS 12.0*  HGB 12.3  HCT 38.7  MCV 87.6  PLT 324   Basic Metabolic Panel: Recent Labs  Lab 06/15/19 2127  NA 137  K 3.5  CL 96*  CO2 28  GLUCOSE 110*  BUN 9  CREATININE 0.49  CALCIUM 8.6*   GFR: Estimated Creatinine Clearance: 116.9 mL/min (by C-G formula based on SCr of 0.49 mg/dL). Liver Function Tests: Recent Labs  Lab 06/15/19 2127  AST 20  ALT 31  ALKPHOS 84  BILITOT 0.3  PROT 6.8  ALBUMIN 3.2*   No results for input(s): LIPASE, AMYLASE in the last 168 hours. No results for input(s): AMMONIA in the last 168 hours. Coagulation Profile: No results for input(s): INR, PROTIME in the last 168 hours. Cardiac Enzymes: No results for input(s): CKTOTAL, CKMB, CKMBINDEX, TROPONINI in the last 168 hours. BNP (last 3 results) No results for input(s): PROBNP in the last 8760  hours. HbA1C: No results for input(s): HGBA1C in the last 72 hours. CBG: No results for input(s): GLUCAP in the last 168 hours. Lipid Profile: No results for input(s): CHOL, HDL, LDLCALC, TRIG, CHOLHDL, LDLDIRECT in the last 72 hours. Thyroid Function Tests: No results for input(s): TSH, T4TOTAL, FREET4, T3FREE, THYROIDAB in the last 72 hours. Anemia Panel: No results for input(s): VITAMINB12, FOLATE, FERRITIN, TIBC, IRON, RETICCTPCT in the last 72 hours. Urine analysis:    Component Value Date/Time   COLORURINE YELLOW (A) 06/15/2019 2127   APPEARANCEUR HAZY (A) 06/15/2019 2127   LABSPEC 1.018 06/15/2019 2127   PHURINE 6.0 06/15/2019 2127   GLUCOSEU NEGATIVE 06/15/2019 2127   HGBUR NEGATIVE 06/15/2019 2127   BILIRUBINUR NEGATIVE 06/15/2019 2127   KETONESUR NEGATIVE 06/15/2019 2127   PROTEINUR NEGATIVE 06/15/2019 2127   NITRITE NEGATIVE 06/15/2019 2127   LEUKOCYTESUR TRACE (A) 06/15/2019 2127    Radiological Exams on Admission: CT Maxillofacial W Contrast  Result Date: 06/15/2019 CLINICAL DATA:  Initial evaluation for acute infection, facial cellulitis.  EXAM: CT MAXILLOFACIAL WITH CONTRAST TECHNIQUE: Multidetector CT imaging of the maxillofacial structures was performed with intravenous contrast. Multiplanar CT image reconstructions were also generated. CONTRAST:  29mL OMNIPAQUE IOHEXOL 300 MG/ML  SOLN COMPARISON:  None. FINDINGS: Osseous: No acute osseous abnormality. No discrete lytic or blastic osseous lesions. Few scattered dental caries/periapical lucencies noted about the teeth. No associated inflammatory changes. Orbits: Globes and orbital soft tissues within normal limits. No evidence for intraorbital or postseptal cellulitis. Sinuses: Mild scattered mucosal thickening noted within the ethmoidal air cells, sphenoid sinuses, and maxillary sinuses. Few scattered superimposed pneumatized secretions noted at the left sphenoid and maxillary sinus. Mastoid air cells and middle ear cavities are well pneumatized and free of fluid. Soft tissues: Multifocal areas of soft tissue swelling seen about the central and right forehead (series 2, images 8, 13), concerning for acute infection/cellulitis areas involved demonstrate mild phlegmonous change without discrete abscess about the forehead. Additional focal area of swelling and inflammatory stranding seen at the right nasal bridge/medial canthus (series 2, image 50), also concerning for infection. 6 mm rim enhancing hypodensity within this region suspicious for a small early abscess (series 2, image 46). Again, no evidence for associated postseptal or intraorbital extension at this time. An additional area of focal swelling and phlegmonous change seen at the left temporal scalp as well (series 2, image 35). No abscess within this region. Additional mild inflammatory stranding noted overlying the right zygomatic region without discrete collection. Few scattered well-circumscribed soft tissue lesion seen within the parotid glands bilaterally. There are 2 lesions on the right, largest of which measures 10 x 12 x 16 mm  (series 2, image 69). Two lesions seen on the left, largest of which measures 10 x 8 x 12 mm (series 2, image 71). Findings are indeterminate. 12 x 11 mm (series 2, image 69). Single lesion Limited intracranial: Visualized intracranial contents demonstrate no acute finding. No evidence for intracranial extension of infection at this time. IMPRESSION: 1. Multifocal areas of soft tissue swelling and phlegmonous change about the forehead, right nasal bridge, right zygomatic region, and left temporal scalp as above, concerning for acute infection/cellulitis. 6 mm rim enhancing hypodensity at the right nasal bridge suspicious for a small early abscess. No evidence for postseptal or intracranial extension of infection at this time. 2. Few scattered dental caries/periapical lucencies about the teeth without associated inflammation. 3. Few scattered well-circumscribed soft tissue lesions within the bilateral parotid glands as above, indeterminate. Outpatient ENT  referral for further workup and consultation suggested. 4. Mild paranasal sinus disease as above. Electronically Signed   By: Rise Mu M.D.   On: 06/15/2019 23:58    Assessment/Plan  Generalized body scattered abscess/cellulitis Suspect likely MRSA abscesses. Continue vancomycin and Rocephin  Diabetes unclear Type 1 or 2- pt endorse Type 1 but appears to have progressed from gestational diabetes Normally takes 15 units Lantus in the morning and humalog sliding scale  start with moderate SSI  Continue gabapentin  Tobacco abuse nicotine patch daily   Hx of polysubstance abuse  Noted to have cocaine use in the past- pt denies any drug use currently continue suboxone  [place] substance report checked by pharmacy and patient lasted filled on 05/18/19   Depression Continue Wellbutrin, Remeron   Morbid obesity BMI > 42   DVT prophylaxis:SCDs Code Status: Full Family Communication: Plan discussed with patient at bedside  disposition  Plan: Home with at least 2 midnight stays  Consults called:  Admission status: inpatient  Zayra Devito T Ikram Riebe DO [facility]   If 7PM-7AM, please contact night-coverage [email or web address]   06/16/2019, 12:26 AM

## 2019-06-16 NOTE — Progress Notes (Signed)
Assumed care of patient at this time. Patient alert oriented, however does not appear to be in the best mood. Patient requests that we do no touch her belonging and was no able to do an assment of her left upper leg abscess per her refusal. States her pain is currently well managed. She does appear to have some abscess on her face that are red. No acute findings noted at this time safety checks completed and call light placed within reach.

## 2019-06-16 NOTE — ED Notes (Signed)
Pt given turkey sandwich tray 

## 2019-06-16 NOTE — Progress Notes (Signed)
Pharmacy Antibiotic Note  Charlotte Stewart is a 34 y.o. female admitted on 06/15/2019 with cellulitis.  Pharmacy has been consulted for Vancomycin and Unasyn dosing.  Pt received loading dose of 2gm in ED  Plan: 1) Vancomycin 1000 mg IV Q 12 hrs. Goal AUC 400-550. Expected AUC: 510.6, estimated trough ~ 14.5 SCr used: 0.8  2) Unasyn 3g IV Q6 hours  Height: 5\' 2"  (157.5 cm) Weight: 242 lb 4.6 oz (109.9 kg) IBW/kg (Calculated) : 50.1  Temp (24hrs), Avg:98.8 F (37.1 C), Min:98.2 F (36.8 C), Max:99.9 F (37.7 C)  Recent Labs  Lab 06/15/19 2127 06/16/19 0936  WBC 15.6* 10.4  CREATININE 0.49 0.47  LATICACIDVEN 1.2  1.2  --     Estimated Creatinine Clearance: 119 mL/min (by C-G formula based on SCr of 0.47 mg/dL).    Allergies  Allergen Reactions  . Methylprednisolone Swelling  . Aspirin Other (See Comments)    Nose bleeds  . Naloxone Swelling and Other (See Comments)    Throat swelling   . Strawberry Extract     Antimicrobials this admission: Vancomycin 3/2 >>  Unasyn 3/3>> CTX 3/3 x1  Dose adjustments this admission:   Microbiology results:  BCx: NGTD  Wound cx:  pending  MRSA PCR: positive  Thank you for allowing pharmacy to be a part of this patient's care.  Angeldejesus Callaham A Amand Lemoine 06/16/2019 2:00 PM

## 2019-06-16 NOTE — Consult Note (Addendum)
[facility] SURGICAL CONSULTATION NOTE (initial) - cpt: [id number]   HISTORY OF PRESENT ILLNESS (HPI):  34 y.o. female presented to [facility] ED on 03/02 for evaluation of multiple skin sores/wounds. Patient reports a recent history of bed bug infestation, reportedly in October 2020, at her place of residence and had been battling chronic sores since this time. However, over the last week she notes that there has been a progressive worsening of some of these "sores." She reports they have become larger, erythematous, and more painful to the touch. These are worst on her forehead, nasal bridge, buttock/legs. She was seen for this and started on Augmentin which has not helped. She notes a subjective fever, chills, and malaise with these sores. She denied any history of similar in the past and states "Ive never had a boil on my body before." She has a history of IV drug abuse but did not specify. History of IDDM as well. Work up in the ED was concerning for leukocytosis to 15.6K, tachycardia, and multiple areas of of cellulitis vs abscess. The largest abscess on her left posterior thigh was I&D'd by the EDP. Unfortunately, no cultures were obtained. She is MRSA + by nasal swab. She was admitted to medicine for IV ABx and management.   Surgery is consulted by hospitalist physician Dr. Loletha Grayer, MD in this context for evaluation and management of multiple abscess.   PAST MEDICAL HISTORY (PMH):  Past Medical History:  Diagnosis Date  . Hypertension      PAST SURGICAL HISTORY ([place]):  History reviewed. No pertinent surgical history.   MEDICATIONS:  Prior to Admission medications   Medication Sig Start Date End Date Taking? Authorizing Provider  buprenorphine (SUBUTEX) 8 MG SUBL SL tablet Place under the tongue 2 (two) times daily.   Yes [provider]  buPROPion (WELLBUTRIN) 100 MG tablet Take 100 mg by mouth daily.    Yes [provider]  gabapentin (NEURONTIN) 600  MG tablet Take 1,200 mg by mouth 2 (two) times daily.  04/19/19  Yes [provider]  insulin glargine (LANTUS) 100 UNIT/ML injection Inject 10-15 Units into the skin daily.   Yes [provider]  mirtazapine (REMERON) 45 MG tablet Take 45 mg by mouth at bedtime.   Yes [provider]  rOPINIRole (REQUIP) 0.25 MG tablet Take 0.25 mg by mouth at bedtime.   Yes [provider]  clindamycin (CLEOCIN) 300 MG capsule Take 300 mg by mouth 3 (three) times daily.    [provider]  hydrOXYzine (VISTARIL) 50 MG capsule Take 1 capsule (50 mg total) by mouth 3 (three) times daily as needed for itching. Patient not taking: Reported on 06/16/2019 03/23/19   Cuthriell, Charline Bills, PA-C  ondansetron (ZOFRAN ODT) 4 MG disintegrating tablet Take 1 tablet (4 mg total) by mouth every 8 (eight) hours as needed for nausea or vomiting. 05/01/18   Earleen Newport, MD  prednisoLONE 5 MG (21) TBPK Take by mouth.    [provider]  sertraline (ZOLOFT) 50 MG tablet Take 50 mg by mouth daily.    [provider]  triamcinolone cream (KENALOG) 0.1 % Apply 1 application topically 4 (four) times daily. Patient not taking: Reported on 06/16/2019 03/23/19   Cuthriell, Charline Bills, PA-C     ALLERGIES:  Allergies  Allergen Reactions  . Methylprednisolone Swelling  . Aspirin Other (See Comments)    Nose bleeds  . Naloxone Swelling and Other (See Comments)    Throat swelling   .  Strawberry Extract      SOCIAL HISTORY:  Social History   Socioeconomic History  . Marital status: Single    Spouse name: Not on file  . Number of children: Not on file  . Years of education: Not on file  . Highest education level: Not on file  Occupational History  . Not on file  Tobacco Use  . Smoking status: Current Every Day Smoker    Packs/day: 0.25  . Smokeless tobacco: Never Used  Substance and Sexual Activity  . Alcohol use: Not Currently  . Drug use: Not Currently  .  Sexual activity: Not Currently  Other Topics Concern  . Not on file  Social History Narrative  . Not on file   Social Determinants of Health   Financial Resource Strain:   . Difficulty of Paying Living Expenses: Not on file  Food Insecurity:   . Worried About Programme researcher, broadcasting/film/video in the Last Year: Not on file  . Ran Out of Food in the Last Year: Not on file  Transportation Needs:   . Lack of Transportation (Medical): Not on file  . Lack of Transportation (Non-Medical): Not on file  Physical Activity:   . Days of Exercise per Week: Not on file  . Minutes of Exercise per Session: Not on file  Stress:   . Feeling of Stress : Not on file  Social Connections:   . Frequency of Communication with Friends and Family: Not on file  . Frequency of Social Gatherings with Friends and Family: Not on file  . Attends Religious Services: Not on file  . Active Member of Clubs or Organizations: Not on file  . Attends Banker Meetings: Not on file  . Marital Status: Not on file  Intimate Partner Violence:   . Fear of Current or Ex-Partner: Not on file  . Emotionally Abused: Not on file  . Physically Abused: Not on file  . Sexually Abused: Not on file     FAMILY HISTORY:  History reviewed. No pertinent family history.    REVIEW OF SYSTEMS:  Review of Systems  Constitutional: Positive for chills, fever (Subjective) and malaise/fatigue.  HENT: Negative for congestion and sore throat.   Respiratory: Negative for cough and shortness of breath.   Cardiovascular: Negative for chest pain and palpitations.  Gastrointestinal: Negative for abdominal pain, diarrhea, nausea and vomiting.  Genitourinary: Negative for dysuria and urgency.  Skin:       + Abscesses, numerous  All other systems reviewed and are negative.   VITAL SIGNS:  Temp:  [98.2 F (36.8 C)-99.9 F (37.7 C)] 98.3 F (36.8 C) (03/03 0604) Pulse Rate:  [83-117] 83 (03/03 0604) Resp:  [16-18] 16 (03/03 0604) BP:  (118-138)/(66-91) 121/73 (03/03 0604) SpO2:  [99 %-100 %] 100 % (03/03 0604) Weight:  [106.6 kg-109.9 kg] 109.9 kg (03/03 0128)     Height: 5\' 2"  (157.5 cm) Weight: 109.9 kg BMI (Calculated): 44.3   INTAKE/OUTPUT:  03/02 0701 - 03/03 0700 In: 1500 [IV Piggyback:1500] Out: 900 [Urine:900]  PHYSICAL EXAM:  Physical Exam Vitals and nursing note reviewed. Exam conducted with a chaperone present.  Constitutional:      General: She is not in acute distress.    Appearance: Normal appearance. She is obese. She is not ill-appearing.  HENT:     Head:      Mouth/Throat:     Comments: Poor dentition Eyes:     General: No scleral icterus.    Extraocular Movements: Extraocular  movements intact.     Conjunctiva/sclera: Conjunctivae normal.     Pupils: Pupils are equal, round, and reactive to light.  Cardiovascular:     Rate and Rhythm: Normal rate and regular rhythm.     Pulses: Normal pulses.  Pulmonary:     Effort: Pulmonary effort is normal. No respiratory distress.     Breath sounds: Normal breath sounds.  Genitourinary:    Comments: deferred Musculoskeletal:       Back:       Legs:  Skin:    General: Skin is warm and dry.     Findings: Erythema present.  Neurological:     General: No focal deficit present.     Mental Status: She is alert and oriented to person, place, and time.  Psychiatric:        Mood and Affect: Mood normal.        Behavior: Behavior normal.      Labs:  CBC Latest Ref Rng & Units 06/16/2019 06/15/2019 06/11/2018  WBC 4.0 - 10.5 K/uL 10.4 15.6(H) 8.5  Hemoglobin 12.0 - 15.0 g/dL 11.9(L) 12.3 10.8(L)  Hematocrit 36.0 - 46.0 % 37.4 38.7 32.0(L)  Platelets 150 - 400 K/uL 214 240 184   CMP Latest Ref Rng & Units 06/16/2019 06/15/2019 06/11/2018  Glucose 70 - 99 mg/dL 038(U) 828(M) 99  BUN 6 - 20 mg/dL 10 9 9   Creatinine 0.44 - 1.00 mg/dL 0.34 9.17)  Sodium 135 - 145 mmol/L 139 137 137  Potassium 3.5 - 5.1 mmol/L 4.0 3.5 3.7  Chloride 98 - 111 mmol/L  103 96(L) 105  CO2 22 - 32 mmol/L 26 28 21(L)  Calcium 8.9 - 10.3 mg/dL 9.15(A) 5.6(P) 9.4  Total Protein 6.5 - 8.1 g/dL - 6.8 6.8  Total Bilirubin 0.3 - 1.2 mg/dL - 0.3 0.3  Alkaline Phos 38 - 126 U/L - 84 80  AST 15 - 41 U/L - 20 19  ALT 0 - 44 U/L - 31 24     Imaging studies:   CT Maxillofacial (03/02) personally reviewed and radiologist report reviewed below:  IMPRESSION: 1. Multifocal areas of soft tissue swelling and phlegmonous change about the forehead, right nasal bridge, right zygomatic region, and left temporal scalp as above, concerning for acute infection/cellulitis. 6 mm rim enhancing hypodensity at the right nasal bridge suspicious for a small early abscess. No evidence for postseptal or intracranial extension of infection at this time. 2. Few scattered dental caries/periapical lucencies about the teeth without associated inflammation. 3. Few scattered well-circumscribed soft tissue lesions within the bilateral parotid glands as above, indeterminate. Outpatient ENT referral for further workup and consultation suggested. 4. Mild paranasal sinus disease as above.    Assessment/Plan: (ICD-10's: L03.90) 34 y.o. female with multiple areas concerning for abscess/cellulitis including fail, back, and legs, complicated by pertinent comorbidities including history of IVDA and IDDM.   - No indication for emergent surgical intervention. We will closely follow to ensure she does not need additional I&D  - Recommend ENT evaluation for facial cellulitis/abscesses  - Continue IV ABx (Vancomycin + Unasyn); follow up BCx; no growth to this point    - pain control prn   - further management per primary service; we will follow   All of the above findings and recommendations were discussed with the patient, and all of patient's questions were answered to her expressed satisfaction.  Thank you for the opportunity to participate in this patient's care.   -- 38,  PA-C [facility] 06/16/2019, 10:41 AM [phone] M-F: 7am - 4pm

## 2019-06-16 NOTE — Progress Notes (Signed)
Med change Patient reports swelling reaction to naloxone. Buprenorphine/naloxone combo drug changed to buprenorphine single agent to take per home regimen

## 2019-06-16 NOTE — Progress Notes (Signed)
Patient ID: Charlotte Stewart, female   DOB: 22-May-1985, 34 y.o.   MRN: [id number] [facility] PROGRESS NOTE  Charlotte Stewart [id number]:[id number] DOB: [birth year] DOA: 06/15/2019 PCP: [facility] [place]  HPI/Subjective: Patient was admitted overnight with severe pain in her left buttock and these abscesses getting worse over the past week.  Patient has an abscess on her forehead and face and a couple other areas on her abdomen which are smaller.  The 2 larger ones are on her left buttock.  Objective: Vitals:   06/16/19 0128 06/16/19 0604  BP: 118/66 121/73  Pulse: 89 83  Resp: 16 16  Temp: 98.2 F (36.8 C) 98.3 F (36.8 C)  SpO2: 99% 100%    Intake/Output Summary (Last 24 hours) at 06/16/2019 1322 Last data filed at 06/16/2019 0604 Gross per 24 hour  Intake 1500 ml  Output 900 ml  Net 600 ml   Filed Weights   06/15/19 2034 06/16/19 0128  Weight: 106.6 kg 109.9 kg    ROS: Review of Systems  Constitutional: Negative for chills and fever.  Eyes: Negative for blurred vision.  Respiratory: Negative for cough and shortness of breath.   Cardiovascular: Negative for chest pain.  Gastrointestinal: Negative for abdominal pain, constipation, diarrhea, nausea and vomiting.  Genitourinary: Negative for dysuria.  Musculoskeletal: Positive for joint pain and myalgias.  Neurological: Negative for dizziness and headaches.   Exam: Physical Exam  Constitutional: She is oriented to person, place, and time.  HENT:  Nose: No mucosal edema.  Mouth/Throat: No oropharyngeal exudate or posterior oropharyngeal edema.  Eyes: Pupils are equal, round, and reactive to light. Conjunctivae, EOM and lids are normal.  Neck: No JVD present. Carotid bruit is not present. No thyroid mass and no thyromegaly present.  Cardiovascular: S1 normal and S2 normal. Exam reveals no gallop.  No murmur heard. Pulses:      Dorsalis pedis pulses are 2+ on the right side and 2+ on the left side.  Respiratory: No  respiratory distress. She has decreased breath sounds in the right lower field and the left lower field. She has no wheezes. She has no rhonchi. She has no rales.  GI: Soft. Bowel sounds are normal. There is no abdominal tenderness.  Musculoskeletal:     Cervical back: No edema.     Right ankle: No swelling.     Left ankle: No swelling.  Lymphadenopathy:    She has no cervical adenopathy.  Neurological: She is alert and oriented to person, place, and time. No cranial nerve deficit.  Skin: Skin is warm. Nails show no clubbing.  Large abscess seen on forehead.  Smaller abscess on right bridge of nose.  Smaller areas over the abdomen.  2 large left buttock abscesses that look like were drained by the ER physician.  Psychiatric: She has a normal mood and affect.      Data Reviewed: Basic Metabolic Panel: Recent Labs  Lab 06/15/19 2127 06/16/19 0936  NA 137 139  K 3.5 4.0  CL 96* 103  CO2 28 26  GLUCOSE 110* 150*  BUN 9 10  CREATININE 0.49 0.47  CALCIUM 8.6* 8.4*   Liver Function Tests: Recent Labs  Lab 06/15/19 2127  AST 20  ALT 31  ALKPHOS 84  BILITOT 0.3  PROT 6.8  ALBUMIN 3.2*   CBC: Recent Labs  Lab 06/15/19 2127 06/16/19 0936  WBC 15.6* 10.4  NEUTROABS 12.0*  --   HGB 12.3 11.9*  HCT 38.7 37.4  MCV 87.6 88.6  PLT 240  214    CBG: Recent Labs  Lab 06/16/19 0154 06/16/19 0806 06/16/19 1223  GLUCAP 172* 119* 137*    Recent Results (from the past 240 hour(s))  MRSA PCR Screening     Status: Abnormal   Collection Time: 06/15/19  9:27 PM   Specimen: Nasopharyngeal  Result Value Ref Range Status   MRSA by PCR POSITIVE (A) NEGATIVE Final    Comment:        The GeneXpert MRSA Assay (FDA approved for NASAL specimens only), is one component of a comprehensive MRSA colonization surveillance program. It is not intended to diagnose MRSA infection nor to guide or monitor treatment for MRSA infections. RESULT CALLED TO, READ BACK BY AND VERIFIED  WITH: Garnet Sierras RN [phone] 06/15/19 HNM Performed at [facility], [address] [place], [place] [address]   Blood culture (routine x 2)     Status: None (Preliminary result)   Collection Time: 06/15/19  9:27 PM   Specimen: BLOOD  Result Value Ref Range Status   Specimen Description BLOOD LEFT ANTECUBITAL  Final   Special Requests   Final    BOTTLES DRAWN AEROBIC AND ANAEROBIC Blood Culture adequate volume   Culture   Final    NO GROWTH < 12 HOURS Performed at [facility], [address], [place], [place] [address]    Report Status PENDING  Incomplete  Blood culture (routine x 2)     Status: None (Preliminary result)   Collection Time: 06/15/19  9:27 PM   Specimen: BLOOD  Result Value Ref Range Status   Specimen Description BLOOD RIGHT ANTECUBITAL  Final   Special Requests   Final    BOTTLES DRAWN AEROBIC AND ANAEROBIC Blood Culture results may not be optimal due to an inadequate volume of blood received in culture bottles   Culture   Final    NO GROWTH < 12 HOURS Performed at [facility], [address] [place], [place] [address]    Report Status PENDING  Incomplete  Respiratory Panel by RT PCR (Flu A&B, Covid) -     Status: None   Collection Time: 06/15/19  9:27 PM  Result Value Ref Range Status   SARS Coronavirus 2 by RT PCR NEGATIVE NEGATIVE Final    Comment: (NOTE) SARS-CoV-2 target nucleic acids are NOT DETECTED. The SARS-CoV-2 RNA is generally detectable in upper respiratoy specimens during the acute phase of infection. The lowest concentration of SARS-CoV-2 viral copies this assay can detect is 131 copies/mL. A negative result does not preclude SARS-Cov-2 infection and should not be used as the sole basis for treatment or other patient management decisions. A negative result may occur with  improper specimen collection/handling, submission of specimen other than nasopharyngeal swab, presence of viral mutation(s) within the areas  targeted by this assay, and inadequate number of viral copies (<131 copies/mL). A negative result must be combined with clinical observations, patient history, and epidemiological information. The expected result is Negative. Fact Sheet for Patients:  https://www.moore.com/ Fact Sheet for Healthcare Providers:  https://www.young.biz/ This test is not yet ap proved or cleared by the [place] FDA and  has been authorized for detection and/or diagnosis of SARS-CoV-2 by FDA under an Emergency Use Authorization (EUA). This EUA will remain  in effect (meaning this test can be used) for the duration of the COVID-19 declaration under Section 564(b)(1) of the Act, 21 U.S.C. section 360bbb-3(b)(1), unless the authorization is terminated or revoked sooner.    Influenza A by PCR  NEGATIVE NEGATIVE Final   Influenza B by PCR NEGATIVE NEGATIVE Final    Comment: (NOTE) The Xpert Xpress SARS-CoV-2/FLU/RSV assay is intended as an aid in  the diagnosis of influenza from Nasopharyngeal swab specimens and  should not be used as a sole basis for treatment. Nasal washings and  aspirates are unacceptable for Xpert Xpress SARS-CoV-2/FLU/RSV  testing. Fact Sheet for Patients: https://www.moore.com/ Fact Sheet for Healthcare Providers: https://www.young.biz/ This test is not yet approved or cleared by the [place] FDA and  has been authorized for detection and/or diagnosis of SARS-CoV-2 by  FDA under an Emergency Use Authorization (EUA). This EUA will remain  in effect (meaning this test can be used) for the duration of the  Covid-19 declaration under Section 564(b)(1) of the Act, 21  U.S.C. section 360bbb-3(b)(1), unless the authorization is  terminated or revoked. Performed at [facility], [address] [place], [place] [address]      Studies: CT Maxillofacial W Contrast  Result Date:  06/15/2019 CLINICAL DATA:  Initial evaluation for acute infection, facial cellulitis. EXAM: CT MAXILLOFACIAL WITH CONTRAST TECHNIQUE: Multidetector CT imaging of the maxillofacial structures was performed with intravenous contrast. Multiplanar CT image reconstructions were also generated. CONTRAST:  47mL OMNIPAQUE IOHEXOL 300 MG/ML  SOLN COMPARISON:  None. FINDINGS: Osseous: No acute osseous abnormality. No discrete lytic or blastic osseous lesions. Few scattered dental caries/periapical lucencies noted about the teeth. No associated inflammatory changes. Orbits: Globes and orbital soft tissues within normal limits. No evidence for intraorbital or postseptal cellulitis. Sinuses: Mild scattered mucosal thickening noted within the ethmoidal air cells, sphenoid sinuses, and maxillary sinuses. Few scattered superimposed pneumatized secretions noted at the left sphenoid and maxillary sinus. Mastoid air cells and middle ear cavities are well pneumatized and free of fluid. Soft tissues: Multifocal areas of soft tissue swelling seen about the central and right forehead (series 2, images 8, 13), concerning for acute infection/cellulitis areas involved demonstrate mild phlegmonous change without discrete abscess about the forehead. Additional focal area of swelling and inflammatory stranding seen at the right nasal bridge/medial canthus (series 2, image 50), also concerning for infection. 6 mm rim enhancing hypodensity within this region suspicious for a small early abscess (series 2, image 46). Again, no evidence for associated postseptal or intraorbital extension at this time. An additional area of focal swelling and phlegmonous change seen at the left temporal scalp as well (series 2, image 35). No abscess within this region. Additional mild inflammatory stranding noted overlying the right zygomatic region without discrete collection. Few scattered well-circumscribed soft tissue lesion seen within the parotid glands  bilaterally. There are 2 lesions on the right, largest of which measures 10 x 12 x 16 mm (series 2, image 69). Two lesions seen on the left, largest of which measures 10 x 8 x 12 mm (series 2, image 71). Findings are indeterminate. 12 x 11 mm (series 2, image 69). Single lesion Limited intracranial: Visualized intracranial contents demonstrate no acute finding. No evidence for intracranial extension of infection at this time. IMPRESSION: 1. Multifocal areas of soft tissue swelling and phlegmonous change about the forehead, right nasal bridge, right zygomatic region, and left temporal scalp as above, concerning for acute infection/cellulitis. 6 mm rim enhancing hypodensity at the right nasal bridge suspicious for a small early abscess. No evidence for postseptal or intracranial extension of infection at this time. 2. Few scattered dental caries/periapical lucencies about the teeth without associated inflammation. 3. Few scattered well-circumscribed soft tissue lesions within the bilateral parotid  glands as above, indeterminate. Outpatient ENT referral for further workup and consultation suggested. 4. Mild paranasal sinus disease as above. Electronically Signed   By: Rise Mu M.D.   On: 06/15/2019 23:58    Scheduled Meds: . buprenorphine  8 mg Sublingual BID  . buPROPion  100 mg Oral Daily  . gabapentin  1,200 mg Oral BID  . insulin aspart  0-15 Units Subcutaneous TID WC  . ketorolac  15 mg Intravenous Once  . mirtazapine  45 mg Oral QHS  . nicotine  21 mg Transdermal Daily  . rOPINIRole  0.25 mg Oral QHS   Continuous Infusions: . cefTRIAXone (ROCEPHIN)  IV    . vancomycin 1,000 mg (06/16/19 1033)    Assessment/Plan:  1. Multiple abscesses.  2 largest ones are in the left buttock.  Looks like the ER physician tried to drain that but I do not see a wound culture.  I ordered a wound culture.  Asked general surgery to see the patient and Dr. Everlene Farrier just wants to watch the patient with IV  antibiotics at this time.  Dr. Everlene Farrier recommend an ENT consult for the forehead abscess.  Consulted Dr. Willeen Cass for evaluation.  Continue vancomycin and change Rocephin over to Unasyn.  Follow-up wound culture. 2. Chronic pain on Subutex 3. Anxiety depression on Wellbutrin, Remeron and Zoloft. 4. Send urine toxicology. 5. Impaired fasting glucose.  Send off a hemoglobin A1c  Code Status:     Code Status Orders  (From admission, onward)         Start     Ordered   06/16/19 0023  Full code  Continuous     06/16/19 0022        Code Status History    Date Active Date Inactive Code Status Order ID Comments User Context   09/30/2018 1617 09/30/2018 2112 Full Code [id number]  Oswaldo Conroy, CNM Inpatient   07/08/2018 1702 07/08/2018 2210 Full Code [id number]  Oswaldo Conroy, CNM Inpatient   06/11/2018 0217 06/11/2018 0900 Full Code [id number]  Tresea Mall, CNM Inpatient   Advance Care Planning Activity     Family Communication: Called patient's mother to give an update.  She was glad that I called to let her know that her daughter was in the hospital.  She is taking care of the patient's 2 kids.  Disposition Plan: Likely will need a few days of IV antibiotics to settle down the pain of these abscesses prior to conversion over to oral medications.  Consultants:  General surgery  ENT  Antibiotics:  IV vancomycin  IV Unasyn  Time spent: 32 minutes  [employer]uel [employer]

## 2019-06-16 NOTE — Plan of Care (Signed)
Patient doing well and and aware of her conditions

## 2019-06-16 NOTE — Consult Note (Signed)
Charlotte Stewart, Charlotte Stewart [id number] November 13, 1985 Riley Nearing, MD  Reason for Consult: Facial abscesses Requesting Physician: Loletha Grayer, MD Consulting Physician: Riley Nearing, MD  HPI: This 34 y.o. year old female was admitted on 06/15/2019 for Cellulitis [L03.90] MRSA (methicillin resistant Staphylococcus aureus) [A49.02] Facial cellulitis [L03.211] Sepsis due to cellulitis ([place]) [L03.90, A41.9]. Maxillofacial CT showed area of cellulitis on the forehead and right medial canthus with possible early abscess at the latter site. Also noted nodules in parotid on either side. Currently on Unasyn and Vanc.   Allergies:  Allergies  Allergen Reactions  . Methylprednisolone Swelling  . Aspirin Other (See Comments)    Nose bleeds  . Naloxone Swelling and Other (See Comments)    Throat swelling   . Strawberry Extract     Medications:  Medications Prior to Admission  Medication Sig Dispense Refill  . buprenorphine (SUBUTEX) 8 MG SUBL SL tablet Place under the tongue 2 (two) times daily.    Marland Kitchen buPROPion (WELLBUTRIN) 100 MG tablet Take 100 mg by mouth daily.     Marland Kitchen gabapentin (NEURONTIN) 600 MG tablet Take 1,200 mg by mouth 2 (two) times daily.     . insulin glargine (LANTUS) 100 UNIT/ML injection Inject 10-15 Units into the skin daily.    . mirtazapine (REMERON) 45 MG tablet Take 45 mg by mouth at bedtime.    Marland Kitchen rOPINIRole (REQUIP) 0.25 MG tablet Take 0.25 mg by mouth at bedtime.    . clindamycin (CLEOCIN) 300 MG capsule Take 300 mg by mouth 3 (three) times daily.    . hydrOXYzine (VISTARIL) 50 MG capsule Take 1 capsule (50 mg total) by mouth 3 (three) times daily as needed for itching. (Patient not taking: Reported on 06/16/2019) 30 capsule 0  . ondansetron (ZOFRAN ODT) 4 MG disintegrating tablet Take 1 tablet (4 mg total) by mouth every 8 (eight) hours as needed for nausea or vomiting. 20 tablet 0  . prednisoLONE 5 MG (21) TBPK Take by mouth.    . sertraline (ZOLOFT) 50 MG tablet Take 50 mg by  mouth daily.    Marland Kitchen triamcinolone cream (KENALOG) 0.1 % Apply 1 application topically 4 (four) times daily. (Patient not taking: Reported on 06/16/2019) 30 g 0  .  Current Facility-Administered Medications  Medication Dose Route Frequency Provider Last Rate Last Admin  . acetaminophen (TYLENOL) tablet 650 mg  650 mg Oral Q6H PRN Tu, Ching T, DO      . Ampicillin-Sulbactam (UNASYN) 3 g in sodium chloride 0.9 % 100 mL IVPB  3 g Intravenous Q6H Nazari, Walid A, RPH 200 mL/hr at 06/16/19 1434 3 g at 06/16/19 1434  . buprenorphine (SUBUTEX) SL tablet 8 mg  8 mg Sublingual BID Sharion Settler, NP   8 mg at 06/16/19 1013  . buPROPion [facility]) tablet 100 mg  100 mg Oral Daily Tu, Ching T, DO   100 mg at 06/16/19 1014  . gabapentin (NEURONTIN) tablet 1,200 mg  1,200 mg Oral BID Tu, Ching T, DO   1,200 mg at 06/16/19 1013  . ibuprofen (ADVIL) tablet 600 mg  600 mg Oral Q8H PRN Wieting, Richard, MD      . insulin aspart (novoLOG) injection 0-15 Units  0-15 Units Subcutaneous TID WC Tu, Ching T, DO   2 Units at 06/16/19 1311  . mirtazapine (REMERON) tablet 45 mg  45 mg Oral QHS Tu, Ching T, DO   45 mg at 06/16/19 0215  . nicotine (NICODERM CQ - dosed in mg/24 hours) patch 21  mg  21 mg Transdermal Daily Tu, Ching T, DO   21 mg at 06/16/19 1013  . rOPINIRole (REQUIP) tablet 0.25 mg  0.25 mg Oral QHS Tu, Ching T, DO   0.25 mg at 06/16/19 0215  . vancomycin (VANCOCIN) IVPB 1000 mg/200 mL premix  1,000 mg Intravenous Q12H Valrie Hart A, RPH 200 mL/hr at 06/16/19 1033 1,000 mg at 06/16/19 1033    PMH:  Past Medical History:  Diagnosis Date  . Hypertension     Fam Hx: History reviewed. No pertinent family history.  Soc Hx:  Social History   Socioeconomic History  . Marital status: Single    Spouse name: Not on file  . Number of children: Not on file  . Years of education: Not on file  . Highest education level: Not on file  Occupational History  . Not on file  Tobacco Use  . Smoking status:  Current Every Day Smoker    Packs/day: 0.25  . Smokeless tobacco: Never Used  Substance and Sexual Activity  . Alcohol use: Not Currently  . Drug use: Not Currently  . Sexual activity: Not Currently  Other Topics Concern  . Not on file  Social History Narrative  . Not on file   Social Determinants of Health   Financial Resource Strain:   . Difficulty of Paying Living Expenses: Not on file  Food Insecurity:   . Worried About Programme researcher, broadcasting/film/video in the Last Year: Not on file  . Ran Out of Food in the Last Year: Not on file  Transportation Needs:   . Lack of Transportation (Medical): Not on file  . Lack of Transportation (Non-Medical): Not on file  Physical Activity:   . Days of Exercise per Week: Not on file  . Minutes of Exercise per Session: Not on file  Stress:   . Feeling of Stress : Not on file  Social Connections:   . Frequency of Communication with Friends and Family: Not on file  . Frequency of Social Gatherings with Friends and Family: Not on file  . Attends Religious Services: Not on file  . Active Member of Clubs or Organizations: Not on file  . Attends Banker Meetings: Not on file  . Marital Status: Not on file  Intimate Partner Violence:   . Fear of Current or Ex-Partner: Not on file  . Emotionally Abused: Not on file  . Physically Abused: Not on file  . Sexually Abused: Not on file    PSH: History reviewed. No pertinent surgical history.. Procedures since admission: No admission procedures for hospital encounter.  ROS: Review of systems normal other than 12 systems except per HPI.  PHYSICAL EXAM Vitals:  Vitals:   06/16/19 0604 06/16/19 1500  BP: 121/73 119/69  Pulse: 83 86  Resp: 16 16  Temp: 98.3 F (36.8 C) 98.4 F (36.9 C)  SpO2: 100% 100%  .  General: Well-developed, Well-nourished in no acute distress Mood: Mood and affect well adjusted, pleasant and cooperative. Orientation: Groggy but oriented. Vocal Quality: No hoarseness.  Communicates verbally. head and Face: Partially draining abscesses of the forehead at hairline. Larger to right of midline is more tense. Smaller to left appears to have drained more. There is an area of non-fluctuant cellulitis left temple with dry scabbing that may have spontaneously drained at some point. There is tender fluctuance right medial canthus to glabella.  Facial strength normal and symmetric. Ears: External ears with normal landmarks, no lesions. External auditory canals free  of infection, cerumen impaction or lesions. Tympanic membranes intact with good landmarks and normal mobility on pneumatic otoscopy. No middle ear effusion. Hearing: Speech reception grossly normal. Nose: External nose normal with midline dorsum and no lesions or deformity. Nasal Cavity reveals essentially midline septum with normal inferior turbinates. No significant mucosal congestion or erythema. Nasal secretions are minimal and clear. No polyps seen on anterior rhinoscopy. Oral Cavity/ Oropharynx: Lips are normal with no lesions. Teeth possible dental caries. Gingiva healthy with no lesions or gingivitis. Merrily Brittle. Indirect Laryngoscopy/Nasopharyngoscopy: Visualization of the larynx, hypopharynx and nasopharynx is not possible in this setting with routine examination. Neck: Supple and symmetric with no palpable masses, tenderness or crepitance. The trachea is midline. Thyroid gland is soft, nontender and symmetric with no masses or enlargement. Parotid and submandibular glands are soft, nontender and symmetric, except for focal tenderness in each parotid where inflamed nodes are noted. Lymphatic: Tender nodes in the parotid gland region bilaterally, non-fluctuant Respiratory: Normal respiratory effort without labored breathing. Cardiovascular: Carotid pulse shows regular rate and rhythm Neurologic: Cranial Nerves II through XII are grossly intact. Eyes: Gaze and Ocular Motility are grossly normal. PERRLA. No visible  nystagmus.  MEDICAL DECISION MAKING: Data Review:  Results for orders placed or performed during the hospital encounter of 06/15/19 (from the past 48 hour(s))  Lactic acid, plasma     Status: None   Collection Time: 06/15/19  9:27 PM  Result Value Ref Range   Lactic Acid, Venous 1.2 0.5 - 1.9 mmol/L    Comment: Performed at [facility], [address] [place], [place] [address]  Lactic acid, plasma     Status: None   Collection Time: 06/15/19  9:27 PM  Result Value Ref Range   Lactic Acid, Venous 1.2 0.5 - 1.9 mmol/L    Comment: Performed at [facility], [address] [place], [place] [address]  Comprehensive metabolic panel     Status: Abnormal   Collection Time: 06/15/19  9:27 PM  Result Value Ref Range   Sodium 137 135 - 145 mmol/L   Potassium 3.5 3.5 - 5.1 mmol/L   Chloride 96 (L) 98 - 111 mmol/L   CO2 28 22 - 32 mmol/L   Glucose, Bld 110 (H) 70 - 99 mg/dL    Comment: Glucose reference range applies only to samples taken after fasting for at least 8 hours.   BUN 9 6 - 20 mg/dL   Creatinine, Ser 9.620.49 0.44 - 1.00 mg/dL   Calcium 8.6 (L) 8.9 - 10.3 mg/dL   Total Protein 6.8 6.5 - 8.1 g/dL   Albumin 3.2 (L) 3.5 - 5.0 g/dL   AST 20 15 - 41 U/L   ALT 31 0 - 44 U/L   Alkaline Phosphatase 84 38 - 126 U/L   Total Bilirubin 0.3 0.3 - 1.2 mg/dL   GFR calc non Af Amer >60 >60 mL/min   GFR calc Af Amer >60 >60 mL/min   Anion gap 13 5 - 15    Comment: Performed at [facility], [address] [place], [place] [address]  CBC with Differential     Status: Abnormal   Collection Time: 06/15/19  9:27 PM  Result Value Ref Range   WBC 15.6 (H) 4.0 - 10.5 K/uL   RBC 4.42 3.87 - 5.11 MIL/uL   Hemoglobin 12.3 12.0 - 15.0 g/dL   HCT 13.238.7 44.036.0 - 10.246.0 %   MCV 87.6 80.0 - 100.0 fL   MCH 27.8 26.0 - 34.0  pg   MCHC 31.8 30.0 - 36.0 g/dL   RDW 20.2 54.2 - 70.6 %   Platelets 240 150 - 400 K/uL   nRBC 0.0 0.0 - 0.2 %   Neutrophils Relative % 76 %   Neutro Abs  12.0 (H) 1.7 - 7.7 K/uL   Lymphocytes Relative 18 %   Lymphs Abs 2.8 0.7 - 4.0 K/uL   Monocytes Relative 4 %   Monocytes Absolute 0.6 0.1 - 1.0 K/uL   Eosinophils Relative 1 %   Eosinophils Absolute 0.1 0.0 - 0.5 K/uL   Basophils Relative 0 %   Basophils Absolute 0.0 0.0 - 0.1 K/uL   Immature Granulocytes 1 %   Abs Immature Granulocytes 0.09 (H) 0.00 - 0.07 K/uL    Comment: Performed at [facility], [address] [place], [place] [address]  Urinalysis, Complete w Microscopic     Status: Abnormal   Collection Time: 06/15/19  9:27 PM  Result Value Ref Range   Color, Urine YELLOW (A) YELLOW   APPearance HAZY (A) CLEAR   Specific Gravity, Urine 1.018 1.005 - 1.030   pH 6.0 5.0 - 8.0   Glucose, UA NEGATIVE NEGATIVE mg/dL   Hgb urine dipstick NEGATIVE NEGATIVE   Bilirubin Urine NEGATIVE NEGATIVE   Ketones, ur NEGATIVE NEGATIVE mg/dL   Protein, ur NEGATIVE NEGATIVE mg/dL   Nitrite NEGATIVE NEGATIVE   Leukocytes,Ua TRACE (A) NEGATIVE   RBC / HPF 0-5 0 - 5 RBC/hpf   WBC, UA 0-5 0 - 5 WBC/hpf   Bacteria, UA NONE SEEN NONE SEEN   Squamous Epithelial / LPF 11-20 0 - 5   Mucus PRESENT     Comment: Performed at [facility], [address] [place], [place] [address]  MRSA PCR Screening     Status: Abnormal   Collection Time: 06/15/19  9:27 PM   Specimen: Nasopharyngeal  Result Value Ref Range   MRSA by PCR POSITIVE (A) NEGATIVE    Comment:        The GeneXpert MRSA Assay (FDA approved for NASAL specimens only), is one component of a comprehensive MRSA colonization surveillance program. It is not intended to diagnose MRSA infection nor to guide or monitor treatment for MRSA infections. RESULT CALLED TO, READ BACK BY AND VERIFIED WITH: Garnet Sierras RN [phone] 06/15/19 HNM Performed at [facility], [address] [place], [place] [address]   Blood gas, venous     Status: Abnormal   Collection Time: 06/15/19  9:27 PM  Result Value Ref Range   pH,  Ven 7.38 7.250 - 7.430   pCO2, Ven 55 44.0 - 60.0 mmHg   pO2, Ven 33.0 32.0 - 45.0 mmHg   Bicarbonate 32.5 (H) 20.0 - 28.0 mmol/L   Acid-Base Excess 5.8 (H) 0.0 - 2.0 mmol/L   O2 Saturation 62.1 %   Patient temperature 37.0    Collection site VEIN    Sample type VENOUS     Comment: Performed at [facility], [address] [place], [place] [address]  Blood culture (routine x 2)     Status: None (Preliminary result)   Collection Time: 06/15/19  9:27 PM   Specimen: BLOOD  Result Value Ref Range   Specimen Description BLOOD LEFT ANTECUBITAL    Special Requests      BOTTLES DRAWN AEROBIC AND ANAEROBIC Blood Culture adequate volume   Culture      NO GROWTH < 12 HOURS Performed at [facility], [address] [place],  [place] [address]    Report Status PENDING   Blood culture (routine x 2)     Status: None (Preliminary result)   Collection Time: 06/15/19  9:27 PM   Specimen: BLOOD  Result Value Ref Range   Specimen Description BLOOD RIGHT ANTECUBITAL    Special Requests      BOTTLES DRAWN AEROBIC AND ANAEROBIC Blood Culture results may not be optimal due to an inadequate volume of blood received in culture bottles   Culture      NO GROWTH < 12 HOURS Performed at [facility], [address], [place], [place] [address]    Report Status PENDING   Respiratory Panel by RT PCR (Flu A&B, Covid) -     Status: None   Collection Time: 06/15/19  9:27 PM  Result Value Ref Range   SARS Coronavirus 2 by RT PCR NEGATIVE NEGATIVE    Comment: (NOTE) SARS-CoV-2 target nucleic acids are NOT DETECTED. The SARS-CoV-2 RNA is generally detectable in upper respiratoy specimens during the acute phase of infection. The lowest concentration of SARS-CoV-2 viral copies this assay can detect is 131 copies/mL. A negative result does not preclude SARS-Cov-2 infection and should not be used as the sole basis for treatment or other patient management decisions. A negative result  may occur with  improper specimen collection/handling, submission of specimen other than nasopharyngeal swab, presence of viral mutation(s) within the areas targeted by this assay, and inadequate number of viral copies (<131 copies/mL). A negative result must be combined with clinical observations, patient history, and epidemiological information. The expected result is Negative. Fact Sheet for Patients:  https://www.moore.com/ Fact Sheet for Healthcare Providers:  https://www.young.biz/ This test is not yet ap proved or cleared by the [place] FDA and  has been authorized for detection and/or diagnosis of SARS-CoV-2 by FDA under an Emergency Use Authorization (EUA). This EUA will remain  in effect (meaning this test can be used) for the duration of the COVID-19 declaration under Section 564(b)(1) of the Act, 21 U.S.C. section 360bbb-3(b)(1), unless the authorization is terminated or revoked sooner.    Influenza A by PCR NEGATIVE NEGATIVE   Influenza B by PCR NEGATIVE NEGATIVE    Comment: (NOTE) The Xpert Xpress SARS-CoV-2/FLU/RSV assay is intended as an aid in  the diagnosis of influenza from Nasopharyngeal swab specimens and  should not be used as a sole basis for treatment. Nasal washings and  aspirates are unacceptable for Xpert Xpress SARS-CoV-2/FLU/RSV  testing. Fact Sheet for Patients: https://www.moore.com/ Fact Sheet for Healthcare Providers: https://www.young.biz/ This test is not yet approved or cleared by the [place] FDA and  has been authorized for detection and/or diagnosis of SARS-CoV-2 by  FDA under an Emergency Use Authorization (EUA). This EUA will remain  in effect (meaning this test can be used) for the duration of the  Covid-19 declaration under Section 564(b)(1) of the Act, 21  U.S.C. section 360bbb-3(b)(1), unless the authorization is  terminated or revoked. Performed at  [facility], [address] [place], [place] [address]   Pregnancy, urine     Status: None   Collection Time: 06/15/19  9:27 PM  Result Value Ref Range   Preg Test, Ur NEGATIVE NEGATIVE    Comment: Performed at [facility], [address] [place], [place] [address]  Glucose, capillary     Status: Abnormal   Collection Time: 06/16/19  1:54 AM  Result Value Ref Range   Glucose-Capillary 172 (H) 70 - 99 mg/dL  Comment: Glucose reference range applies only to samples taken after fasting for at least 8 hours.   Comment 1 Notify RN   Glucose, capillary     Status: Abnormal   Collection Time: 06/16/19  8:06 AM  Result Value Ref Range   Glucose-Capillary 119 (H) 70 - 99 mg/dL    Comment: Glucose reference range applies only to samples taken after fasting for at least 8 hours.  HIV Antibody (routine testing w rflx)     Status: None   Collection Time: 06/16/19  9:36 AM  Result Value Ref Range   HIV Screen 4th Generation wRfx NON REACTIVE NON REACTIVE    Comment: Performed at [facility], [address] [place], [place] [address]  Basic metabolic panel     Status: Abnormal   Collection Time: 06/16/19  9:36 AM  Result Value Ref Range   Sodium 139 135 - 145 mmol/L   Potassium 4.0 3.5 - 5.1 mmol/L   Chloride 103 98 - 111 mmol/L   CO2 26 22 - 32 mmol/L   Glucose, Bld 150 (H) 70 - 99 mg/dL    Comment: Glucose reference range applies only to samples taken after fasting for at least 8 hours.   BUN 10 6 - 20 mg/dL   Creatinine, Ser 2.59 0.44 - 1.00 mg/dL   Calcium 8.4 (L) 8.9 - 10.3 mg/dL   GFR calc non Af Amer >60 >60 mL/min   GFR calc Af Amer >60 >60 mL/min   Anion gap 10 5 - 15    Comment: Performed at [facility], [address] [place], [place] [address]  CBC     Status: Abnormal   Collection Time: 06/16/19  9:36 AM  Result Value Ref Range   WBC 10.4 4.0 - 10.5 K/uL   RBC 4.22 3.87 - 5.11 MIL/uL   Hemoglobin 11.9 (L) 12.0 - 15.0 g/dL   HCT  56.4 33.2 - 95.1 %   MCV 88.6 80.0 - 100.0 fL   MCH 28.2 26.0 - 34.0 pg   MCHC 31.8 30.0 - 36.0 g/dL   RDW 88.4 16.6 - 06.3 %   Platelets 214 150 - 400 K/uL   nRBC 0.0 0.0 - 0.2 %    Comment: Performed at [facility], [address] [place], [place] [address]  Hemoglobin A1c     Status: None   Collection Time: 06/16/19  9:36 AM  Result Value Ref Range   Hgb A1c MFr Bld 5.6 4.8 - 5.6 %    Comment: (NOTE) Pre diabetes:          5.7%-6.4% Diabetes:              >6.4% Glycemic control for   <7.0% adults with diabetes    Mean Plasma Glucose 114.02 mg/dL    Comment: Performed at [facility], [address] [place], [place] [address]  Glucose, capillary     Status: Abnormal   Collection Time: 06/16/19 12:23 PM  Result Value Ref Range   Glucose-Capillary 137 (H) 70 - 99 mg/dL    Comment: Glucose reference range applies only to samples taken after fasting for at least 8 hours.  . CT Maxillofacial W Contrast  Result Date: 06/15/2019 CLINICAL DATA:  Initial evaluation for acute infection, facial cellulitis. EXAM: CT MAXILLOFACIAL WITH CONTRAST TECHNIQUE: Multidetector CT imaging of the maxillofacial structures was performed with intravenous contrast. Multiplanar CT image reconstructions were also generated. CONTRAST:  7mL OMNIPAQUE IOHEXOL 300 MG/ML  SOLN COMPARISON:  None. FINDINGS: Osseous: No acute osseous abnormality. No discrete lytic or blastic osseous lesions. Few scattered dental caries/periapical lucencies noted about the teeth. No associated inflammatory changes. Orbits: Globes and orbital soft tissues within normal limits. No evidence for intraorbital or postseptal cellulitis. Sinuses: Mild scattered mucosal thickening noted within the ethmoidal air cells, sphenoid sinuses, and maxillary sinuses. Few scattered superimposed pneumatized secretions noted at the left sphenoid and maxillary sinus. Mastoid air cells and middle ear cavities are well pneumatized and free of  fluid. Soft tissues: Multifocal areas of soft tissue swelling seen about the central and right forehead (series 2, images 8, 13), concerning for acute infection/cellulitis areas involved demonstrate mild phlegmonous change without discrete abscess about the forehead. Additional focal area of swelling and inflammatory stranding seen at the right nasal bridge/medial canthus (series 2, image 50), also concerning for infection. 6 mm rim enhancing hypodensity within this region suspicious for a small early abscess (series 2, image 46). Again, no evidence for associated postseptal or intraorbital extension at this time. An additional area of focal swelling and phlegmonous change seen at the left temporal scalp as well (series 2, image 35). No abscess within this region. Additional mild inflammatory stranding noted overlying the right zygomatic region without discrete collection. Few scattered well-circumscribed soft tissue lesion seen within the parotid glands bilaterally. There are 2 lesions on the right, largest of which measures 10 x 12 x 16 mm (series 2, image 69). Two lesions seen on the left, largest of which measures 10 x 8 x 12 mm (series 2, image 71). Findings are indeterminate. 12 x 11 mm (series 2, image 69). Single lesion Limited intracranial: Visualized intracranial contents demonstrate no acute finding. No evidence for intracranial extension of infection at this time. IMPRESSION: 1. Multifocal areas of soft tissue swelling and phlegmonous change about the forehead, right nasal bridge, right zygomatic region, and left temporal scalp as above, concerning for acute infection/cellulitis. 6 mm rim enhancing hypodensity at the right nasal bridge suspicious for a small early abscess. No evidence for postseptal or intracranial extension of infection at this time. 2. Few scattered dental caries/periapical lucencies about the teeth without associated inflammation. 3. Few scattered well-circumscribed soft tissue  lesions within the bilateral parotid glands as above, indeterminate. Outpatient ENT referral for further workup and consultation suggested. 4. Mild paranasal sinus disease as above. Electronically Signed   By: Rise MuBenjamin  McClintock M.D.   On: 06/15/2019 23:58  .   PROCEDURE: Preoperative Diagnosis: Multiple facial abscesses Postoperative Diagnosis: Same Procedure: Incision and drainage of multiple facial abscesses (3) Indications: Facial abscesses Findings:purulence drained from both forehead sites and right medial canthus/glabellar region Description of Procedure: After discussing procedure and risks  (scarring/bleeding) with the patient, the skin of these 3 regions was prepped with betadine and anesthetized with 1% lidocaine with epinephrine, 1:100,000. Notable that the patient initially declined having the worst abscess near the eye drained due to scarring, but after explaining the severity and potential for more serious progression of the infection without I&D she agreed. A 15 blade was used to incise each abscess and purulence expressed. Tenotomy scissors were used to explore the abscess pockets to break up any loculations. The right forehead and right medial canthus/glabellar abscess had the most purulence. The left forehead lesion was already partially decompressed. Saline was used to irrigate each site. The patient tolerated the procedure reasonably well, though the extreme tenderness of each site due to inflammation made it difficult to completely anesthetize.  ASSESSMENT: 1) multiple facial abscesses. 2) Parotid nodules, likely representing reactive adenopathy within the parotid with lymphadenitis  PLAN: The abscesses were successfully drained. Continue Vanc/Unasyn. Gentamicin to wounds TID. The parotid lesions are likely reactive nodes related to the primary infection and should settle down, but I am happy to see her in follow up 2-3 weeks after eventual discharge to recheck them.    Sandi Mealy, MD 06/16/2019 4:42 PM

## 2019-06-16 NOTE — Progress Notes (Signed)
Patient refuses lab draw at this point. However agreeable to a lab draw at 0700. Lab will be back at that time for a draw.

## 2019-06-17 DIAGNOSIS — F329 Major depressive disorder, single episode, unspecified: Secondary | ICD-10-CM

## 2019-06-17 DIAGNOSIS — F419 Anxiety disorder, unspecified: Secondary | ICD-10-CM

## 2019-06-17 DIAGNOSIS — R7301 Impaired fasting glucose: Secondary | ICD-10-CM

## 2019-06-17 DIAGNOSIS — L0201 Cutaneous abscess of face: Secondary | ICD-10-CM

## 2019-06-17 DIAGNOSIS — L0231 Cutaneous abscess of buttock: Secondary | ICD-10-CM

## 2019-06-17 LAB — CBC
HCT: 35.4 % — ABNORMAL LOW (ref 36.0–46.0)
Hemoglobin: 11.1 g/dL — ABNORMAL LOW (ref 12.0–15.0)
MCH: 27.8 pg (ref 26.0–34.0)
MCHC: 31.4 g/dL (ref 30.0–36.0)
MCV: 88.5 fL (ref 80.0–100.0)
Platelets: 213 10*3/uL (ref 150–400)
RBC: 4 MIL/uL (ref 3.87–5.11)
RDW: 13.2 % (ref 11.5–15.5)
WBC: 9.3 10*3/uL (ref 4.0–10.5)
nRBC: 0 % (ref 0.0–0.2)

## 2019-06-17 MED ORDER — BUPRENORPHINE HCL 8 MG SL SUBL
8.0000 mg | SUBLINGUAL_TABLET | Freq: Two times a day (BID) | SUBLINGUAL | Status: DC
  Administered 2019-06-17 – 2019-06-20 (×8): 8 mg via SUBLINGUAL
  Filled 2019-06-17 (×8): qty 1

## 2019-06-17 MED ORDER — LIDOCAINE HCL 1 % IJ SOLN
5.0000 mL | Freq: Once | INTRAMUSCULAR | Status: AC
  Administered 2019-06-17: 5 mL via INTRADERMAL
  Filled 2019-06-17: qty 5

## 2019-06-17 MED ORDER — HYDROMORPHONE HCL 1 MG/ML IJ SOLN
0.5000 mg | INTRAMUSCULAR | Status: DC | PRN
  Administered 2019-06-17 – 2019-06-18 (×5): 0.5 mg via INTRAVENOUS
  Filled 2019-06-17 (×5): qty 0.5

## 2019-06-17 NOTE — Progress Notes (Signed)
Patient ID: Charlotte Stewart, female   DOB: 1985-11-06, 34 y.o.   MRN: [id number] [facility] PROGRESS NOTE  Doreene Forrey WNI:[id number] DOB: Jul 03, 1985 DOA: 06/15/2019 PCP: [facility]  HPI/Subjective: Patient states that she is sleeping a lot.  She states that she never sleeps this much.  She is in severe pain with the buttock abscesses and the forehead abscess.  The forehead abscess is draining.  Objective: Vitals:   06/16/19 2114 06/17/19 0620  BP: 116/71 (!) 107/56  Pulse: 84 92  Resp: 16 16  Temp: 98.3 F (36.8 C) 98.8 F (37.1 C)  SpO2: 98% 96%    Intake/Output Summary (Last 24 hours) at 06/17/2019 1346 Last data filed at 06/17/2019 3818 Gross per 24 hour  Intake 3307.45 ml  Output 950 ml  Net 2357.45 ml   Filed Weights   06/15/19 2034 06/16/19 0128  Weight: 106.6 kg 109.9 kg    ROS: Review of Systems  Constitutional: Positive for malaise/fatigue.  Eyes: Negative for blurred vision.  Respiratory: Negative for cough and shortness of breath.   Cardiovascular: Negative for chest pain.  Gastrointestinal: Negative for abdominal pain.  Genitourinary: Negative for dysuria.  Musculoskeletal: Positive for joint pain and myalgias.  Neurological: Positive for headaches.   Exam: Physical Exam  Constitutional: She is oriented to person, place, and time.  HENT:  Nose: No mucosal edema.  Mouth/Throat: No oropharyngeal exudate or posterior oropharyngeal edema.  Eyes: Pupils are equal, round, and reactive to light. Conjunctivae, EOM and lids are normal.  Neck: No JVD present. Carotid bruit is not present. No thyroid mass and no thyromegaly present.  Cardiovascular: S1 normal and S2 normal. Exam reveals no gallop.  No murmur heard. Pulses:      Dorsalis pedis pulses are 2+ on the right side and 2+ on the left side.  Respiratory: No respiratory distress. She has decreased breath sounds in the right lower field and the left lower field. She has no wheezes. She  has no rhonchi. She has no rales.  GI: Soft. Bowel sounds are normal. There is no abdominal tenderness.  Musculoskeletal:     Cervical back: No edema.     Right ankle: No swelling.     Left ankle: No swelling.  Lymphadenopathy:    She has no cervical adenopathy.  Neurological: She is alert and oriented to person, place, and time. No cranial nerve deficit.  Skin: Skin is warm. Nails show no clubbing.  Large abscess seen on forehead which is smaller than yesterday because it is draining.  Smaller abscess on right bridge of nose.  Smaller areas over the abdomen.  2 large left buttock abscesses that are painful to palpation.  Still with fluctuance in both of them.  Psychiatric: She has a normal mood and affect.      Data Reviewed: Basic Metabolic Panel: Recent Labs  Lab 06/15/19 2127 06/16/19 0936  NA 137 139  K 3.5 4.0  CL 96* 103  CO2 28 26  GLUCOSE 110* 150*  BUN 9 10  CREATININE 0.49 0.47  CALCIUM 8.6* 8.4*   Liver Function Tests: Recent Labs  Lab 06/15/19 2127  AST 20  ALT 31  ALKPHOS 84  BILITOT 0.3  PROT 6.8  ALBUMIN 3.2*   CBC: Recent Labs  Lab 06/15/19 2127 06/16/19 0936 06/17/19 0715  WBC 15.6* 10.4 9.3  NEUTROABS 12.0*  --   --   HGB 12.3 11.9* 11.1*  HCT 38.7 37.4 35.4*  MCV 87.6 88.6 88.5  PLT 240  214 213    CBG: Recent Labs  Lab 06/16/19 0154 06/16/19 0806 06/16/19 1223  GLUCAP 172* 119* 137*    Recent Results (from the past 240 hour(s))  MRSA PCR Screening     Status: Abnormal   Collection Time: 06/15/19  9:27 PM   Specimen: Nasopharyngeal  Result Value Ref Range Status   MRSA by PCR POSITIVE (A) NEGATIVE Final    Comment:        The GeneXpert MRSA Assay (FDA approved for NASAL specimens only), is one component of a comprehensive MRSA colonization surveillance program. It is not intended to diagnose MRSA infection nor to guide or monitor treatment for MRSA infections. RESULT CALLED TO, READ BACK BY AND VERIFIED WITH: Garnet Sierras RN [phone] 06/15/19 HNM Performed at [facility], [address] [place], [place] [address]   Blood culture (routine x 2)     Status: None (Preliminary result)   Collection Time: 06/15/19  9:27 PM   Specimen: BLOOD  Result Value Ref Range Status   Specimen Description BLOOD LEFT ANTECUBITAL  Final   Special Requests   Final    BOTTLES DRAWN AEROBIC AND ANAEROBIC Blood Culture adequate volume   Culture   Final    NO GROWTH 2 DAYS Performed at [facility], [address], [place], [place] [address]    Report Status PENDING  Incomplete  Blood culture (routine x 2)     Status: None (Preliminary result)   Collection Time: 06/15/19  9:27 PM   Specimen: BLOOD  Result Value Ref Range Status   Specimen Description BLOOD RIGHT ANTECUBITAL  Final   Special Requests   Final    BOTTLES DRAWN AEROBIC AND ANAEROBIC Blood Culture results may not be optimal due to an inadequate volume of blood received in culture bottles   Culture   Final    NO GROWTH 2 DAYS Performed at [facility], [address], [place], [place] [address]    Report Status PENDING  Incomplete  Respiratory Panel by RT PCR (Flu A&B, Covid) -     Status: None   Collection Time: 06/15/19  9:27 PM  Result Value Ref Range Status   SARS Coronavirus 2 by RT PCR NEGATIVE NEGATIVE Final    Comment: (NOTE) SARS-CoV-2 target nucleic acids are NOT DETECTED. The SARS-CoV-2 RNA is generally detectable in upper respiratoy specimens during the acute phase of infection. The lowest concentration of SARS-CoV-2 viral copies this assay can detect is 131 copies/mL. A negative result does not preclude SARS-Cov-2 infection and should not be used as the sole basis for treatment or other patient management decisions. A negative result may occur with  improper specimen collection/handling, submission of specimen other than nasopharyngeal swab, presence of viral mutation(s) within the areas targeted by this assay,  and inadequate number of viral copies (<131 copies/mL). A negative result must be combined with clinical observations, patient history, and epidemiological information. The expected result is Negative. Fact Sheet for Patients:  https://www.moore.com/ Fact Sheet for Healthcare Providers:  https://www.young.biz/ This test is not yet ap proved or cleared by the [place] FDA and  has been authorized for detection and/or diagnosis of SARS-CoV-2 by FDA under an Emergency Use Authorization (EUA). This EUA will remain  in effect (meaning this test can be used) for the duration of the COVID-19 declaration under Section 564(b)(1) of the Act, 21 U.S.C. section 360bbb-3(b)(1), unless the authorization is terminated or revoked sooner.    Influenza A by PCR NEGATIVE  NEGATIVE Final   Influenza B by PCR NEGATIVE NEGATIVE Final    Comment: (NOTE) The Xpert Xpress SARS-CoV-2/FLU/RSV assay is intended as an aid in  the diagnosis of influenza from Nasopharyngeal swab specimens and  should not be used as a sole basis for treatment. Nasal washings and  aspirates are unacceptable for Xpert Xpress SARS-CoV-2/FLU/RSV  testing. Fact Sheet for Patients: PinkCheek.be Fact Sheet for Healthcare Providers: GravelBags.it This test is not yet approved or cleared by the [place] FDA and  has been authorized for detection and/or diagnosis of SARS-CoV-2 by  FDA under an Emergency Use Authorization (EUA). This EUA will remain  in effect (meaning this test can be used) for the duration of the  Covid-19 declaration under Section 564(b)(1) of the Act, 21  U.S.C. section 360bbb-3(b)(1), unless the authorization is  terminated or revoked. Performed at [facility], [address], [place], [place] [address]      Studies: CT Maxillofacial W Contrast  Result Date: 06/15/2019 CLINICAL DATA:  Initial  evaluation for acute infection, facial cellulitis. EXAM: CT MAXILLOFACIAL WITH CONTRAST TECHNIQUE: Multidetector CT imaging of the maxillofacial structures was performed with intravenous contrast. Multiplanar CT image reconstructions were also generated. CONTRAST:  5mL OMNIPAQUE IOHEXOL 300 MG/ML  SOLN COMPARISON:  None. FINDINGS: Osseous: No acute osseous abnormality. No discrete lytic or blastic osseous lesions. Few scattered dental caries/periapical lucencies noted about the teeth. No associated inflammatory changes. Orbits: Globes and orbital soft tissues within normal limits. No evidence for intraorbital or postseptal cellulitis. Sinuses: Mild scattered mucosal thickening noted within the ethmoidal air cells, sphenoid sinuses, and maxillary sinuses. Few scattered superimposed pneumatized secretions noted at the left sphenoid and maxillary sinus. Mastoid air cells and middle ear cavities are well pneumatized and free of fluid. Soft tissues: Multifocal areas of soft tissue swelling seen about the central and right forehead (series 2, images 8, 13), concerning for acute infection/cellulitis areas involved demonstrate mild phlegmonous change without discrete abscess about the forehead. Additional focal area of swelling and inflammatory stranding seen at the right nasal bridge/medial canthus (series 2, image 50), also concerning for infection. 6 mm rim enhancing hypodensity within this region suspicious for a small early abscess (series 2, image 46). Again, no evidence for associated postseptal or intraorbital extension at this time. An additional area of focal swelling and phlegmonous change seen at the left temporal scalp as well (series 2, image 35). No abscess within this region. Additional mild inflammatory stranding noted overlying the right zygomatic region without discrete collection. Few scattered well-circumscribed soft tissue lesion seen within the parotid glands bilaterally. There are 2 lesions on the  right, largest of which measures 10 x 12 x 16 mm (series 2, image 69). Two lesions seen on the left, largest of which measures 10 x 8 x 12 mm (series 2, image 71). Findings are indeterminate. 12 x 11 mm (series 2, image 69). Single lesion Limited intracranial: Visualized intracranial contents demonstrate no acute finding. No evidence for intracranial extension of infection at this time. IMPRESSION: 1. Multifocal areas of soft tissue swelling and phlegmonous change about the forehead, right nasal bridge, right zygomatic region, and left temporal scalp as above, concerning for acute infection/cellulitis. 6 mm rim enhancing hypodensity at the right nasal bridge suspicious for a small early abscess. No evidence for postseptal or intracranial extension of infection at this time. 2. Few scattered dental caries/periapical lucencies about the teeth without associated inflammation. 3. Few scattered well-circumscribed soft tissue lesions within the bilateral parotid glands  as above, indeterminate. Outpatient ENT referral for further workup and consultation suggested. 4. Mild paranasal sinus disease as above. Electronically Signed   By: Rise Mu M.D.   On: 06/15/2019 23:58    Scheduled Meds: . buprenorphine  8 mg Sublingual BID  . buPROPion  100 mg Oral Daily  . gabapentin  1,200 mg Oral BID  . gentamicin ointment   Topical TID  . mirtazapine  45 mg Oral QHS  . nicotine  21 mg Transdermal Daily  . rOPINIRole  0.25 mg Oral QHS   Continuous Infusions: . ampicillin-sulbactam (UNASYN) IV 3 g (06/17/19 1962)  . vancomycin 1,000 mg (06/17/19 1118)    Assessment/Plan:  1. Multiple abscesses.  2 largest ones are in the left buttock.  Those are still painful.  Forehead abscess is draining.  Continue antibiotics vancomycin and Unasyn.  Patient asking for medication for pain.  As needed pain medication ordered. 2. Chronic pain on Subutex 3. Anxiety depression on Wellbutrin, Remeron and  Zoloft. 4. Impaired fasting glucose.  Patient is not a diabetic.  Code Status:     Code Status Orders  (From admission, onward)         Start     Ordered   06/16/19 0023  Full code  Continuous     06/16/19 0022        Code Status History    Date Active Date Inactive Code Status Order ID Comments User Context   09/30/2018 1617 09/30/2018 2112 Full Code [id number]  Oswaldo Conroy, CNM Inpatient   07/08/2018 1702 07/08/2018 2210 Full Code [id number]  Oswaldo Conroy, CNM Inpatient   06/11/2018 0217 06/11/2018 0900 Full Code [id number]  Tresea Mall, CNM Inpatient   Advance Care Planning Activity     Disposition Plan: The patient's abscesses are starting to drain.  Still very painful for the patient.  At this point reevaluate on a daily basis on when to go home.  Hopefully home in the next day or 2.  Consultants:  General surgery  ENT  Antibiotics:  IV vancomycin  IV Unasyn  Time spent: 26 minutes.  Patient's nurse Tia present with me with speaking with the patient on exam.  Alford Highland  [facility]

## 2019-06-17 NOTE — Progress Notes (Signed)
[facility] SURGICAL PROGRESS NOTE (cpt [phone])  Hospital Day(s): 1.   Interval History: Patient seen and examined, no acute events or new complaints overnight. Patient reports "she is not getting better." She continues to report multiple painful areas and wounds. She did have evaluation from ENT yesterday who preformed I&D of facial abscesses. No fever or chills over night. She continues on IV Vancomycin and Unasyn. Leukocytosis remains resolved at 9.3K.   Review of Systems:  Constitutional: denies fever, chills  HEENT: denies cough or congestion  Respiratory: denies any shortness of breath  Cardiovascular: denies chest pain or palpitations  Gastrointestinal: denies abdominal pain, N/V, or diarrhea/and bowel function as per interval history Genitourinary: denies burning with urination or urinary frequency Musculoskeletal: denies pain, decreased motor or sensation Integumentary: + Multiple skin abscess   Vital signs in last 24 hours: [min-max] current  Temp:  [98.3 F (36.8 C)-98.8 F (37.1 C)] 98.8 F (37.1 C) (03/04 0620) Pulse Rate:  [84-92] 92 (03/04 0620) Resp:  [16] 16 (03/04 0620) BP: (107-119)/(56-71) 107/56 (03/04 0620) SpO2:  [96 %-100 %] 96 % (03/04 0620)     Height: 5\' 2"  (157.5 cm) Weight: 109.9 kg BMI (Calculated): 44.3   Intake/Output last 2 shifts:  03/03 0701 - 03/04 0700 In: 3307.5 [P.O.:1320; IV Piggyback:1987.5] Out: 950 [Urine:950]   Physical Exam:  Constitutional: alert, cooperative and no distress  HENT: Multiple facial abscesses (right forehead, right medial canthus/glabellarm, left forehead ) s/p I&D with ENT   Eyes: PERRL, EOM's grossly intact and symmetric  Respiratory: breathing non-labored at rest  Cardiovascular: regular rate and sinus rhythm  Integumentary: Previous I&D site to the left posterior leg, this remains primarily indurated, erythematous, tender, no appreciable fluctuance, purulence, or crepitus   Labs:  CBC Latest  Ref Rng & Units 06/17/2019 06/16/2019 06/15/2019  WBC 4.0 - 10.5 K/uL 9.3 10.4 15.6(H)  Hemoglobin 12.0 - 15.0 g/dL 11.1(L) 11.9(L) 12.3  Hematocrit 36.0 - 46.0 % 35.4(L) 37.4 38.7  Platelets 150 - 400 K/uL 213 214 240   CMP Latest Ref Rng & Units 06/16/2019 06/15/2019 06/11/2018  Glucose 70 - 99 mg/dL 06/13/2018) 505(L) 99  BUN 6 - 20 mg/dL 10 9 9   Creatinine 0.44 - 1.00 mg/dL 976(B 3.41)  Sodium 135 - 145 mmol/L 139 137 137  Potassium 3.5 - 5.1 mmol/L 4.0 3.5 3.7  Chloride 98 - 111 mmol/L 103 96(L) 105  CO2 22 - 32 mmol/L 26 28 21(L)  Calcium 8.9 - 10.3 mg/dL 9.37) 9.02(I) 9.4  Total Protein 6.5 - 8.1 g/dL - 6.8 6.8  Total Bilirubin 0.3 - 1.2 mg/dL - 0.3 0.3  Alkaline Phos 38 - 126 U/L - 84 80  AST 15 - 41 U/L - 20 19  ALT 0 - 44 U/L - 31 24    Imaging studies: No new pertinent imaging studies   Assessment/Plan: (ICD-10's: L03.90) 34 y.o. female with multiple areas concerning for abscess/cellulitis including fail, back, and legs, complicated by pertinent comorbidities including history of IVDA and IDDM.   - No surgical intervention or additional I&d at this time; we will continue to closely follow and reassess these wound for potential I7d moving forward if indicatied   - Appreciate ENT assistance with facial abscesses   - Continue IV ABx (Vancomycin + Unasyn); follow up BCx; no growth to this point               - pain control prn              -  further management per primary service; we will follow     All of the above findings and recommendations were discussed with the patient, and the medical team, and all of patient's questions were answered to her expressed satisfaction.  -- Edison Simon, PA-C [facility] 06/17/2019, 8:20 AM [phone] M-F: 7am - 4pm

## 2019-06-17 NOTE — Consult Note (Signed)
Charlotte Stewart, Charlotte Stewart [id number] Jun 24, 1985 Sandi Mealy, MD  Reason for Consult: Facial abscess Requesting Physician: Alford Highland, MD Consulting Physician: Sandi Mealy, MD  HPI: This 34 y.o. year old female was admitted on 06/15/2019 for Cellulitis [L03.90] MRSA (methicillin resistant Staphylococcus aureus) [A49.02] Facial cellulitis [L03.211] Sepsis due to cellulitis (HCC) [L03.90, A41.9]. Patient reports better today, especially the abscess near the right eye. Forehead ones still sore but better.  Medications:  Current Facility-Administered Medications  Medication Dose Route Frequency Provider Last Rate Last Admin  . acetaminophen (TYLENOL) tablet 650 mg  650 mg Oral Q6H PRN Tu, Ching T, DO   650 mg at 06/17/19 0845  . Ampicillin-Sulbactam (UNASYN) 3 g in sodium chloride 0.9 % 100 mL IVPB  3 g Intravenous Q6H Nazari, Walid A, RPH 200 mL/hr at 06/17/19 0838 3 g at 06/17/19 0838  . buprenorphine (SUBUTEX) sublingual tablet 8 mg  8 mg Sublingual BID Manuela Schwartz, NP   8 mg at 06/17/19 1051  . buPROPion [facility]) tablet 100 mg  100 mg Oral Daily Tu, Ching T, DO   100 mg at 06/17/19 1050  . gabapentin (NEURONTIN) tablet 1,200 mg  1,200 mg Oral BID Tu, Ching T, DO   1,200 mg at 06/17/19 1051  . gentamicin ointment (GARAMYCIN) 0.1 %   Topical TID Geanie Logan, MD   Given at 06/17/19 1052  . HYDROmorphone (DILAUDID) injection 0.5 mg  0.5 mg Intravenous Q4H PRN Alford Highland, MD   0.5 mg at 06/17/19 1231  . ibuprofen (ADVIL) tablet 600 mg  600 mg Oral Q8H PRN Alford Highland, MD   600 mg at 06/17/19 1050  . lidocaine (XYLOCAINE) 1 % (with pres) injection 5 mL  5 mL Intradermal Once Mila Merry A, RPH      . mirtazapine (REMERON) tablet 45 mg  45 mg Oral QHS Tu, Ching T, DO   45 mg at 06/16/19 2305  . nicotine (NICODERM CQ - dosed in mg/24 hours) patch 21 mg  21 mg Transdermal Daily Tu, Ching T, DO   21 mg at 06/17/19 1052  . rOPINIRole (REQUIP) tablet 0.25 mg  0.25 mg Oral QHS  Tu, Ching T, DO   0.25 mg at 06/16/19 2305  . vancomycin (VANCOCIN) IVPB 1000 mg/200 mL premix  1,000 mg Intravenous Q12H Hall, Scott A, RPH 200 mL/hr at 06/17/19 1118 1,000 mg at 06/17/19 1118  .  Medications Prior to Admission  Medication Sig Dispense Refill  . buprenorphine (SUBUTEX) 8 MG SUBL SL tablet Place under the tongue 2 (two) times daily.    Marland Kitchen buPROPion (WELLBUTRIN) 100 MG tablet Take 100 mg by mouth daily.     Marland Kitchen gabapentin (NEURONTIN) 600 MG tablet Take 1,200 mg by mouth 2 (two) times daily.     . insulin glargine (LANTUS) 100 UNIT/ML injection Inject 10-15 Units into the skin daily.    . mirtazapine (REMERON) 45 MG tablet Take 45 mg by mouth at bedtime.    Marland Kitchen rOPINIRole (REQUIP) 0.25 MG tablet Take 0.25 mg by mouth at bedtime.    . clindamycin (CLEOCIN) 300 MG capsule Take 300 mg by mouth 3 (three) times daily.    . hydrOXYzine (VISTARIL) 50 MG capsule Take 1 capsule (50 mg total) by mouth 3 (three) times daily as needed for itching. (Patient not taking: Reported on 06/16/2019) 30 capsule 0  . ondansetron (ZOFRAN ODT) 4 MG disintegrating tablet Take 1 tablet (4 mg total) by mouth every 8 (eight) hours as needed for nausea or  vomiting. 20 tablet 0  . prednisoLONE 5 MG (21) TBPK Take by mouth.    . sertraline (ZOLOFT) 50 MG tablet Take 50 mg by mouth daily.    Marland Kitchen triamcinolone cream (KENALOG) 0.1 % Apply 1 application topically 4 (four) times daily. (Patient not taking: Reported on 06/16/2019) 30 g 0    Allergies:  Allergies  Allergen Reactions  . Methylprednisolone Swelling  . Aspirin Other (See Comments)    Nose bleeds  . Naloxone Swelling and Other (See Comments)    Throat swelling   . Strawberry Extract     PMH:  Past Medical History:  Diagnosis Date  . Hypertension     Fam Hx: History reviewed. No pertinent family history.  Soc Hx:  Social History   Socioeconomic History  . Marital status: Single    Spouse name: Not on file  . Number of children: Not on file  .  Years of education: Not on file  . Highest education level: Not on file  Occupational History  . Not on file  Tobacco Use  . Smoking status: Current Every Day Smoker    Packs/day: 0.25  . Smokeless tobacco: Never Used  Substance and Sexual Activity  . Alcohol use: Not Currently  . Drug use: Not Currently  . Sexual activity: Not Currently  Other Topics Concern  . Not on file  Social History Narrative  . Not on file   Social Determinants of Health   Financial Resource Strain:   . Difficulty of Paying Living Expenses: Not on file  Food Insecurity:   . Worried About Charity fundraiser in the Last Year: Not on file  . Ran Out of Food in the Last Year: Not on file  Transportation Needs:   . Lack of Transportation (Medical): Not on file  . Lack of Transportation (Non-Medical): Not on file  Physical Activity:   . Days of Exercise per Week: Not on file  . Minutes of Exercise per Session: Not on file  Stress:   . Feeling of Stress : Not on file  Social Connections:   . Frequency of Communication with Friends and Family: Not on file  . Frequency of Social Gatherings with Friends and Family: Not on file  . Attends Religious Services: Not on file  . Active Member of Clubs or Organizations: Not on file  . Attends Archivist Meetings: Not on file  . Marital Status: Not on file  Intimate Partner Violence:   . Fear of Current or Ex-Partner: Not on file  . Emotionally Abused: Not on file  . Physically Abused: Not on file  . Sexually Abused: Not on file    PSH: History reviewed. No pertinent surgical history.. Procedures since admission: No admission procedures for hospital encounter.  ROS: Review of systems normal other than 12 systems except per HPI.  PHYSICAL EXAM  Vitals: Blood pressure (!) 107/56, pulse 92, temperature 98.8 F (37.1 C), temperature source Oral, resp. rate 16, height 5\' 2"  (1.575 m), weight 109.9 kg, SpO2 96 %, not currently breastfeeding.. General:  Well-developed, Well-nourished in no acute distress Mood: Mood and affect yelling and cursing when I entered the room. Later said it was directed at person on the phone. Orientation: Grossly alert and oriented. Vocal Quality: No hoarseness. Communicates verbally. head and Face: Right forehead abscess still inflamed, tender. Can express some purulence with pressure, but patient could not tolerate so stopped to get lidocaine. Left forehead abscess small, slightly fluctant. Mild fluctance of the  right medial canthus/glabellar area. Mostly firm. No redness and minimally tender  MEDICAL DECISION MAKING: Data Review:  Results for orders placed or performed during the hospital encounter of 06/15/19 (from the past 48 hour(s))  Lactic acid, plasma     Status: None   Collection Time: 06/15/19  9:27 PM  Result Value Ref Range   Lactic Acid, Venous 1.2 0.5 - 1.9 mmol/L    Comment: Performed at [facility], [address], [place], [place] [address]  Lactic acid, plasma     Status: None   Collection Time: 06/15/19  9:27 PM  Result Value Ref Range   Lactic Acid, Venous 1.2 0.5 - 1.9 mmol/L    Comment: Performed at [facility], [address] [place], [place] [address]  Comprehensive metabolic panel     Status: Abnormal   Collection Time: 06/15/19  9:27 PM  Result Value Ref Range   Sodium 137 135 - 145 mmol/L   Potassium 3.5 3.5 - 5.1 mmol/L   Chloride 96 (L) 98 - 111 mmol/L   CO2 28 22 - 32 mmol/L   Glucose, Bld 110 (H) 70 - 99 mg/dL    Comment: Glucose reference range applies only to samples taken after fasting for at least 8 hours.   BUN 9 6 - 20 mg/dL   Creatinine, Ser 3.97 0.44 - 1.00 mg/dL   Calcium 8.6 (L) 8.9 - 10.3 mg/dL   Total Protein 6.8 6.5 - 8.1 g/dL   Albumin 3.2 (L) 3.5 - 5.0 g/dL   AST 20 15 - 41 U/L   ALT 31 0 - 44 U/L   Alkaline Phosphatase 84 38 - 126 U/L   Total Bilirubin 0.3 0.3 - 1.2 mg/dL   GFR calc non Af Amer >60 >60 mL/min   GFR calc Af Amer  >60 >60 mL/min   Anion gap 13 5 - 15    Comment: Performed at [facility], [address] [place], [place] [address]  CBC with Differential     Status: Abnormal   Collection Time: 06/15/19  9:27 PM  Result Value Ref Range   WBC 15.6 (H) 4.0 - 10.5 K/uL   RBC 4.42 3.87 - 5.11 MIL/uL   Hemoglobin 12.3 12.0 - 15.0 g/dL   HCT 93.7 90.2 - 40.9 %   MCV 87.6 80.0 - 100.0 fL   MCH 27.8 26.0 - 34.0 pg   MCHC 31.8 30.0 - 36.0 g/dL   RDW 73.5 32.9 - 92.4 %   Platelets 240 150 - 400 K/uL   nRBC 0.0 0.0 - 0.2 %   Neutrophils Relative % 76 %   Neutro Abs 12.0 (H) 1.7 - 7.7 K/uL   Lymphocytes Relative 18 %   Lymphs Abs 2.8 0.7 - 4.0 K/uL   Monocytes Relative 4 %   Monocytes Absolute 0.6 0.1 - 1.0 K/uL   Eosinophils Relative 1 %   Eosinophils Absolute 0.1 0.0 - 0.5 K/uL   Basophils Relative 0 %   Basophils Absolute 0.0 0.0 - 0.1 K/uL   Immature Granulocytes 1 %   Abs Immature Granulocytes 0.09 (H) 0.00 - 0.07 K/uL    Comment: Performed at [facility], [address] [place], [place] [address]  Urinalysis, Complete w Microscopic     Status: Abnormal   Collection Time: 06/15/19  9:27 PM  Result Value Ref Range   Color, Urine YELLOW (A) YELLOW   APPearance HAZY (A) CLEAR   Specific Gravity, Urine 1.018 1.005 - 1.030  pH 6.0 5.0 - 8.0   Glucose, UA NEGATIVE NEGATIVE mg/dL   Hgb urine dipstick NEGATIVE NEGATIVE   Bilirubin Urine NEGATIVE NEGATIVE   Ketones, ur NEGATIVE NEGATIVE mg/dL   Protein, ur NEGATIVE NEGATIVE mg/dL   Nitrite NEGATIVE NEGATIVE   Leukocytes,Ua TRACE (A) NEGATIVE   RBC / HPF 0-5 0 - 5 RBC/hpf   WBC, UA 0-5 0 - 5 WBC/hpf   Bacteria, UA NONE SEEN NONE SEEN   Squamous Epithelial / LPF 11-20 0 - 5   Mucus PRESENT     Comment: Performed at [facility], [address] [place], [place] [address]  MRSA PCR Screening     Status: Abnormal   Collection Time: 06/15/19  9:27 PM   Specimen: Nasopharyngeal  Result Value Ref Range   MRSA by PCR  POSITIVE (A) NEGATIVE    Comment:        The GeneXpert MRSA Assay (FDA approved for NASAL specimens only), is one component of a comprehensive MRSA colonization surveillance program. It is not intended to diagnose MRSA infection nor to guide or monitor treatment for MRSA infections. RESULT CALLED TO, READ BACK BY AND VERIFIED WITH: Garnet SierrasYESSICA AGUIS RN [phone] 06/15/19 HNM Performed at [facility], [address] [place], [place] [address]   Blood gas, venous     Status: Abnormal   Collection Time: 06/15/19  9:27 PM  Result Value Ref Range   pH, Ven 7.38 7.250 - 7.430   pCO2, Ven 55 44.0 - 60.0 mmHg   pO2, Ven 33.0 32.0 - 45.0 mmHg   Bicarbonate 32.5 (H) 20.0 - 28.0 mmol/L   Acid-Base Excess 5.8 (H) 0.0 - 2.0 mmol/L   O2 Saturation 62.1 %   Patient temperature 37.0    Collection site VEIN    Sample type VENOUS     Comment: Performed at [facility], [address] [place], [place] [address]  Blood culture (routine x 2)     Status: None (Preliminary result)   Collection Time: 06/15/19  9:27 PM   Specimen: BLOOD  Result Value Ref Range   Specimen Description BLOOD LEFT ANTECUBITAL    Special Requests      BOTTLES DRAWN AEROBIC AND ANAEROBIC Blood Culture adequate volume   Culture      NO GROWTH 2 DAYS Performed at [facility], [address] [place], [place] [address]    Report Status PENDING   Blood culture (routine x 2)     Status: None (Preliminary result)   Collection Time: 06/15/19  9:27 PM   Specimen: BLOOD  Result Value Ref Range   Specimen Description BLOOD RIGHT ANTECUBITAL    Special Requests      BOTTLES DRAWN AEROBIC AND ANAEROBIC Blood Culture results may not be optimal due to an inadequate volume of blood received in culture bottles   Culture      NO GROWTH 2 DAYS Performed at [facility], [address] [place], [place] [address]    Report Status PENDING   Respiratory Panel by RT PCR (Flu A&B, Covid) -     Status:  None   Collection Time: 06/15/19  9:27 PM  Result Value Ref Range   SARS Coronavirus 2 by RT PCR NEGATIVE NEGATIVE    Comment: (NOTE) SARS-CoV-2 target nucleic acids are NOT DETECTED. The SARS-CoV-2 RNA is generally detectable in upper respiratoy specimens during the acute phase of infection. The lowest concentration of SARS-CoV-2 viral copies this assay can detect is 131 copies/mL.  A negative result does not preclude SARS-Cov-2 infection and should not be used as the sole basis for treatment or other patient management decisions. A negative result may occur with  improper specimen collection/handling, submission of specimen other than nasopharyngeal swab, presence of viral mutation(s) within the areas targeted by this assay, and inadequate number of viral copies (<131 copies/mL). A negative result must be combined with clinical observations, patient history, and epidemiological information. The expected result is Negative. Fact Sheet for Patients:  https://www.moore.com/ Fact Sheet for Healthcare Providers:  https://www.young.biz/ This test is not yet ap proved or cleared by the [place] FDA and  has been authorized for detection and/or diagnosis of SARS-CoV-2 by FDA under an Emergency Use Authorization (EUA). This EUA will remain  in effect (meaning this test can be used) for the duration of the COVID-19 declaration under Section 564(b)(1) of the Act, 21 U.S.C. section 360bbb-3(b)(1), unless the authorization is terminated or revoked sooner.    Influenza A by PCR NEGATIVE NEGATIVE   Influenza B by PCR NEGATIVE NEGATIVE    Comment: (NOTE) The Xpert Xpress SARS-CoV-2/FLU/RSV assay is intended as an aid in  the diagnosis of influenza from Nasopharyngeal swab specimens and  should not be used as a sole basis for treatment. Nasal washings and  aspirates are unacceptable for Xpert Xpress SARS-CoV-2/FLU/RSV  testing. Fact Sheet for  Patients: https://www.moore.com/ Fact Sheet for Healthcare Providers: https://www.young.biz/ This test is not yet approved or cleared by the [place] FDA and  has been authorized for detection and/or diagnosis of SARS-CoV-2 by  FDA under an Emergency Use Authorization (EUA). This EUA will remain  in effect (meaning this test can be used) for the duration of the  Covid-19 declaration under Section 564(b)(1) of the Act, 21  U.S.C. section 360bbb-3(b)(1), unless the authorization is  terminated or revoked. Performed at [facility], [address] [place], [place] [address]   Pregnancy, urine     Status: None   Collection Time: 06/15/19  9:27 PM  Result Value Ref Range   Preg Test, Ur NEGATIVE NEGATIVE    Comment: Performed at [facility], [address] [place], [place] [address]  Glucose, capillary     Status: Abnormal   Collection Time: 06/16/19  1:54 AM  Result Value Ref Range   Glucose-Capillary 172 (H) 70 - 99 mg/dL    Comment: Glucose reference range applies only to samples taken after fasting for at least 8 hours.   Comment 1 Notify RN   Glucose, capillary     Status: Abnormal   Collection Time: 06/16/19  8:06 AM  Result Value Ref Range   Glucose-Capillary 119 (H) 70 - 99 mg/dL    Comment: Glucose reference range applies only to samples taken after fasting for at least 8 hours.  Hemoglobin A1c     Status: None   Collection Time: 06/16/19  9:18 AM  Result Value Ref Range   Hgb A1c MFr Bld 5.6 4.8 - 5.6 %    Comment: (NOTE) Pre diabetes:          5.7%-6.4% Diabetes:              >6.4% Glycemic control for   <7.0% adults with diabetes    Mean Plasma Glucose 114.02 mg/dL    Comment: Performed at [facility], [address], [place], [place] [address]  HIV Antibody (routine testing w rflx)     Status: None   Collection Time: 06/16/19  9:36 AM  Result Value Ref Range   HIV Screen 4th Generation wRfx  NON REACTIVE NON REACTIVE    Comment: Performed at [facility], [address] [place], [place] [address]  Basic metabolic panel     Status: Abnormal   Collection Time: 06/16/19  9:36 AM  Result Value Ref Range   Sodium 139 135 - 145 mmol/L   Potassium 4.0 3.5 - 5.1 mmol/L   Chloride 103 98 - 111 mmol/L   CO2 26 22 - 32 mmol/L   Glucose, Bld 150 (H) 70 - 99 mg/dL    Comment: Glucose reference range applies only to samples taken after fasting for at least 8 hours.   BUN 10 6 - 20 mg/dL   Creatinine, Ser 6.040.47 0.44 - 1.00 mg/dL   Calcium 8.4 (L) 8.9 - 10.3 mg/dL   GFR calc non Af Amer >60 >60 mL/min   GFR calc Af Amer >60 >60 mL/min   Anion gap 10 5 - 15    Comment: Performed at [facility], [address] [place], [place] [address]  CBC     Status: Abnormal   Collection Time: 06/16/19  9:36 AM  Result Value Ref Range   WBC 10.4 4.0 - 10.5 K/uL   RBC 4.22 3.87 - 5.11 MIL/uL   Hemoglobin 11.9 (L) 12.0 - 15.0 g/dL   HCT 11.937.4 14.736.0 - 82.946.0 %   MCV 88.6 80.0 - 100.0 fL   MCH 28.2 26.0 - 34.0 pg   MCHC 31.8 30.0 - 36.0 g/dL   RDW 56.213.3 13.011.5 - 86.515.5 %   Platelets 214 150 - 400 K/uL   nRBC 0.0 0.0 - 0.2 %    Comment: Performed at [facility], [address] [place], [place] [address]  Hemoglobin A1c     Status: None   Collection Time: 06/16/19  9:36 AM  Result Value Ref Range   Hgb A1c MFr Bld 5.6 4.8 - 5.6 %    Comment: (NOTE) Pre diabetes:          5.7%-6.4% Diabetes:              >6.4% Glycemic control for   <7.0% adults with diabetes    Mean Plasma Glucose 114.02 mg/dL    Comment: Performed at [facility], [address] [place], [place] [address]  Glucose, capillary     Status: Abnormal   Collection Time: 06/16/19 12:23 PM  Result Value Ref Range   Glucose-Capillary 137 (H) 70 - 99 mg/dL    Comment: Glucose reference range applies only to samples taken after fasting for at least 8 hours.  CBC     Status: Abnormal   Collection Time:  06/17/19  7:15 AM  Result Value Ref Range   WBC 9.3 4.0 - 10.5 K/uL   RBC 4.00 3.87 - 5.11 MIL/uL   Hemoglobin 11.1 (L) 12.0 - 15.0 g/dL   HCT 84.135.4 (L) 32.436.0 - 40.146.0 %   MCV 88.5 80.0 - 100.0 fL   MCH 27.8 26.0 - 34.0 pg   MCHC 31.4 30.0 - 36.0 g/dL   RDW 02.713.2 25.311.5 - 66.415.5 %   Platelets 213 150 - 400 K/uL   nRBC 0.0 0.0 - 0.2 %    Comment: Performed at [facility], [address] [place], [place] [address]  . CT Maxillofacial W Contrast  Result Date: 06/15/2019 CLINICAL DATA:  Initial evaluation for acute infection, facial cellulitis. EXAM: CT MAXILLOFACIAL WITH CONTRAST TECHNIQUE: Multidetector CT  imaging of the maxillofacial structures was performed with intravenous contrast. Multiplanar CT image reconstructions were also generated. CONTRAST:  81mL OMNIPAQUE IOHEXOL 300 MG/ML  SOLN COMPARISON:  None. FINDINGS: Osseous: No acute osseous abnormality. No discrete lytic or blastic osseous lesions. Few scattered dental caries/periapical lucencies noted about the teeth. No associated inflammatory changes. Orbits: Globes and orbital soft tissues within normal limits. No evidence for intraorbital or postseptal cellulitis. Sinuses: Mild scattered mucosal thickening noted within the ethmoidal air cells, sphenoid sinuses, and maxillary sinuses. Few scattered superimposed pneumatized secretions noted at the left sphenoid and maxillary sinus. Mastoid air cells and middle ear cavities are well pneumatized and free of fluid. Soft tissues: Multifocal areas of soft tissue swelling seen about the central and right forehead (series 2, images 8, 13), concerning for acute infection/cellulitis areas involved demonstrate mild phlegmonous change without discrete abscess about the forehead. Additional focal area of swelling and inflammatory stranding seen at the right nasal bridge/medial canthus (series 2, image 50), also concerning for infection. 6 mm rim enhancing hypodensity within this region suspicious for a  small early abscess (series 2, image 46). Again, no evidence for associated postseptal or intraorbital extension at this time. An additional area of focal swelling and phlegmonous change seen at the left temporal scalp as well (series 2, image 35). No abscess within this region. Additional mild inflammatory stranding noted overlying the right zygomatic region without discrete collection. Few scattered well-circumscribed soft tissue lesion seen within the parotid glands bilaterally. There are 2 lesions on the right, largest of which measures 10 x 12 x 16 mm (series 2, image 69). Two lesions seen on the left, largest of which measures 10 x 8 x 12 mm (series 2, image 71). Findings are indeterminate. 12 x 11 mm (series 2, image 69). Single lesion Limited intracranial: Visualized intracranial contents demonstrate no acute finding. No evidence for intracranial extension of infection at this time. IMPRESSION: 1. Multifocal areas of soft tissue swelling and phlegmonous change about the forehead, right nasal bridge, right zygomatic region, and left temporal scalp as above, concerning for acute infection/cellulitis. 6 mm rim enhancing hypodensity at the right nasal bridge suspicious for a small early abscess. No evidence for postseptal or intracranial extension of infection at this time. 2. Few scattered dental caries/periapical lucencies about the teeth without associated inflammation. 3. Few scattered well-circumscribed soft tissue lesions within the bilateral parotid glands as above, indeterminate. Outpatient ENT referral for further workup and consultation suggested. 4. Mild paranasal sinus disease as above. Electronically Signed   By: Rise Mu M.D.   On: 06/15/2019 23:58  .   Preop Dx: Multiple facial abscesses Procedure: I&D of facial abscesses x 3 Surgeon: S. Gilmore List Anesth: local Procedure: Procedure discussed with patient. The 3 lesions were anesthetized with 1% lidocaine injection. The previous  wounds were then probed to open the drainage tracts which had closed off, using either a cotton tipped applicator or an irrigation catheter. Once opened any material was expressed, and the wounds irrigated with sterile saline. Gentamicin ointment was then applied. The most purulence was from the right forehead wound, though most of that was manually expressed prior to the procedure. Small amount of cloudy drainage from the small forehead lesion, and minimal serous drainage from the medial canthal lesion. The patient tolerated the procedure well.   ASSESSMENT: Multiple facial abscesses  PLAN: Improving. The forehead abscesses required some additional drainage. Attempted to drain more from eye lesion, only got some serous material. Continue Gent ointment, IV Abx  Sandi Mealy, MD 06/17/2019 2:14 PM

## 2019-06-18 ENCOUNTER — Encounter: Payer: Self-pay | Admitting: Anesthesiology

## 2019-06-18 DIAGNOSIS — G8929 Other chronic pain: Secondary | ICD-10-CM

## 2019-06-18 LAB — VANCOMYCIN, TROUGH: Vancomycin Tr: 4 ug/mL — ABNORMAL LOW (ref 15–20)

## 2019-06-18 MED ORDER — DIPHENHYDRAMINE HCL 25 MG PO CAPS
25.0000 mg | ORAL_CAPSULE | Freq: Four times a day (QID) | ORAL | Status: DC | PRN
  Administered 2019-06-18 – 2019-06-19 (×3): 25 mg via ORAL
  Filled 2019-06-18 (×3): qty 1

## 2019-06-18 MED ORDER — HYDROMORPHONE HCL 1 MG/ML IJ SOLN
1.0000 mg | INTRAMUSCULAR | Status: DC | PRN
  Administered 2019-06-18 – 2019-06-20 (×8): 1 mg via INTRAVENOUS
  Filled 2019-06-18 (×8): qty 1

## 2019-06-18 MED ORDER — CHLORHEXIDINE GLUCONATE CLOTH 2 % EX PADS
6.0000 | MEDICATED_PAD | Freq: Every day | CUTANEOUS | Status: DC
  Administered 2019-06-18 – 2019-06-20 (×2): 6 via TOPICAL

## 2019-06-18 MED ORDER — MUPIROCIN 2 % EX OINT
1.0000 "application " | TOPICAL_OINTMENT | Freq: Two times a day (BID) | CUTANEOUS | Status: DC
  Administered 2019-06-18 – 2019-06-19 (×3): 1 via NASAL
  Filled 2019-06-18: qty 22

## 2019-06-18 MED ORDER — VANCOMYCIN HCL 1500 MG/300ML IV SOLN
1500.0000 mg | Freq: Two times a day (BID) | INTRAVENOUS | Status: DC
  Administered 2019-06-18 – 2019-06-20 (×3): 1500 mg via INTRAVENOUS
  Filled 2019-06-18 (×6): qty 300

## 2019-06-18 NOTE — Progress Notes (Signed)
[facility] SURGICAL PROGRESS NOTE (cpt [phone])  Hospital Day(s): 2.   Interval History: Patient seen and examined, no acute events or new complaints overnight. Patient reports that her lower leg wounds are getting worse and she endorses more pain in these today. No fever or chills. She continues on IV Abx and gentamicin ointment. ENT following as well.   Review of Systems:  Constitutional: denies fever, chills  HEENT: denies cough or congestion  Respiratory: denies any shortness of breath  Cardiovascular: denies chest pain or palpitations  Gastrointestinal: denies abdominal pain, N/V, or diarrhea/and bowel function as per interval history Genitourinary: denies burning with urination or urinary frequency Musculoskeletal: denies pain, decreased motor or sensation Integumentary: + Multiple skin abscess  Vital signs in last 24 hours: [min-max] current  Temp:  [98 F (36.7 C)-98.9 F (37.2 C)] 98.9 F (37.2 C) (03/05 0444) Pulse Rate:  [95-102] 99 (03/05 0444) Resp:  [16-20] 20 (03/05 0444) BP: (123-127)/(76-86) 123/76 (03/05 0444) SpO2:  [96 %-100 %] 96 % (03/05 0444)     Height: 5\' 2"  (157.5 cm) Weight: 109.9 kg BMI (Calculated): 44.3   Intake/Output last 2 shifts:  03/04 0701 - 03/05 0700 In: 320.4 [P.O.:118; IV Piggyback:202.4] Out: 2800 [Urine:2800]   Physical Exam:  Constitutional: alert, cooperative and no distress  HENT: Multiple facial abscesses (right forehead, right medial canthus/glabellarm, left forehead ) s/p I&D with ENT   Eyes: PERRL, EOM's grossly intact and symmetric  Respiratory: breathing non-labored at rest  Cardiovascular: regular rate and sinus rhythm  Integumentary: Previous I&D site to the left posterior leg, this remains primarily indurated although this morning it appears like it may be becoming more fluctuant, erythematous, tender, no appreciable fluctuance, purulence, or crepitus   Labs:  CBC Latest Ref Rng & Units 06/17/2019 06/16/2019  06/15/2019  WBC 4.0 - 10.5 K/uL 9.3 10.4 15.6(H)  Hemoglobin 12.0 - 15.0 g/dL 11.1(L) 11.9(L) 12.3  Hematocrit 36.0 - 46.0 % 35.4(L) 37.4 38.7  Platelets 150 - 400 K/uL 213 214 240   CMP Latest Ref Rng & Units 06/16/2019 06/15/2019 06/11/2018  Glucose 70 - 99 mg/dL 06/13/2018) 277(O) 99  BUN 6 - 20 mg/dL 10 9 9   Creatinine 0.44 - 1.00 mg/dL 242(P 5.36)  Sodium 135 - 145 mmol/L 139 137 137  Potassium 3.5 - 5.1 mmol/L 4.0 3.5 3.7  Chloride 98 - 111 mmol/L 103 96(L) 105  CO2 22 - 32 mmol/L 26 28 21(L)  Calcium 8.9 - 10.3 mg/dL 1.44) 3.15(Q) 9.4  Total Protein 6.5 - 8.1 g/dL - 6.8 6.8  Total Bilirubin 0.3 - 1.2 mg/dL - 0.3 0.3  Alkaline Phos 38 - 126 U/L - 84 80  AST 15 - 41 U/L - 20 19  ALT 0 - 44 U/L - 31 24     Imaging studies: No new pertinent imaging studies   Assessment/Plan: (ICD-10's: L03.90) 34 y.o. female with multiple areas concerning for abscess/cellulitis including fail, back, and legs, complicated by pertinent comorbidities includinghistory of IVDA and IDDM.   - No surgical intervention or additional I&D at this today; we will continue to closely follow; I will make her NPO after midnight incase she requires intervention tomorrow   - Appreciate ENT assistance with facial abscesses              - Continue IV ABx (Vancomycin + Unasyn); follow up BCx; no growth to this point - pain control prn - further management per primary service; we will follow  All of the above findings and recommendations were discussed with the patient, and the medical team, and all of patient's questions were answered to her expressed satisfaction.  -- Edison Simon, PA-C [facility] 06/18/2019, 10:26 AM [phone] M-F: 7am - 4pm

## 2019-06-18 NOTE — Progress Notes (Signed)
Pharmacy - Brief Note for Vancomycin dosing  Please refer to Pharmacist's [facility]) note from earlier today for details.  Patient on vancomycin for multiple abscesses of face, back, and thigh/buttocks.  Appears to have had bedside I&D performed 3/4 but do not see where cultures were sent, purulent material was expressed.    Vancomycin trough performed this morning = 4 mcg/ml on vancomycin 1gm IV q12h.  A peak was unable to be performed d/t loss of IV access.   Plan: - Based on subtherapeutic trough (not able to get peak as per above),  will adjust vancomycin to 1500mg  IV q24h Estimated AUC = 462, Pk = 36, Trough 10.5 - monitor renal function - recheck levels if remains on vancomycin > 3-4 days or change in renal function - suggest cultures if undergoes another I&D  , PharmD, BCPS.   Work Cell: [phone] 06/18/2019 1:20 PM

## 2019-06-18 NOTE — Progress Notes (Signed)
Pharmacy Antibiotic Note  Charlotte Stewart is a 34 y.o. female admitted on 06/15/2019 with cellulitis.  Pharmacy has been consulted for Vancomycin and Unasyn dosing.  Pt received loading dose of 2gm in ED  Plan: 1) Will continue Vancomycin 1000 mg IV Q 12 hrs. Goal AUC 400-550. A vancomycin peak and trough were ordered after the patient's 4th dose but the peak was never drawn, therefore will draw peak after this mornings dose and recalculate patient's dose.  Vancomycin trough: 4  2) Unasyn 3g IV Q6 hours  Height: 5\' 2"  (157.5 cm) Weight: 242 lb 4.6 oz (109.9 kg) IBW/kg (Calculated) : 50.1  Temp (24hrs), Avg:98.5 F (36.9 C), Min:98 F (36.7 C), Max:98.9 F (37.2 C)  Recent Labs  Lab 06/15/19 2127 06/16/19 0936 06/17/19 0715 06/18/19 0930  WBC 15.6* 10.4 9.3  --   CREATININE 0.49 0.47  --   --   LATICACIDVEN 1.2  1.2  --   --   --   VANCOTROUGH  --   --   --  4*    Estimated Creatinine Clearance: 119 mL/min (by C-G formula based on SCr of 0.47 mg/dL).    Allergies  Allergen Reactions  . Methylprednisolone Swelling  . Aspirin Other (See Comments)    Nose bleeds  . Naloxone Swelling and Other (See Comments)    Throat swelling   . Strawberry Extract     Antimicrobials this admission: Vancomycin 3/2 >>  Unasyn 3/3>> CTX 3/3 x1  Dose adjustments this admission:   Microbiology results:  BCx: NGTD  Wound cx:  pending  MRSA PCR: positive  Thank you for allowing pharmacy to be a part of this patient's care.  Ronette Hank A Josue Kass 06/18/2019 11:09 AM

## 2019-06-18 NOTE — Progress Notes (Signed)
Patient ID: Charlotte Stewart, female   DOB: [birth year], 34 y.o.   MRN: [id number] [facility] PROGRESS NOTE  Reema Chick SWF:[id number] DOB: 12/25/1985 DOA: 06/15/2019 PCP: [facility]  HPI/Subjective: Patient complaining of a lot of pain still in the left buttocks.  Looks like a scab had formed over the buttock from where it was previously draining.  Patient still sleeping a lot.  Feels better than when she came in.  Objective: Vitals:   06/17/19 2149 06/18/19 0444  BP: 123/83 123/76  Pulse: (!) 102 99  Resp: 20 20  Temp: 98 F (36.7 C) 98.9 F (37.2 C)  SpO2: 100% 96%    Intake/Output Summary (Last 24 hours) at 06/18/2019 1311 Last data filed at 06/18/2019 0900 Gross per 24 hour  Intake 1060.38 ml  Output 1800 ml  Net -739.62 ml   Filed Weights   06/15/19 2034 06/16/19 0128  Weight: 106.6 kg 109.9 kg    ROS: Review of Systems  Constitutional: Positive for malaise/fatigue.  Eyes: Negative for blurred vision.  Respiratory: Negative for cough and shortness of breath.   Cardiovascular: Negative for chest pain.  Gastrointestinal: Negative for abdominal pain.  Genitourinary: Negative for dysuria.  Musculoskeletal: Positive for joint pain and myalgias.  Neurological: Positive for headaches.   Exam: Physical Exam  Constitutional: She is oriented to person, place, and time.  HENT:  Nose: No mucosal edema.  Mouth/Throat: No oropharyngeal exudate or posterior oropharyngeal edema.  Eyes: Pupils are equal, round, and reactive to light. Conjunctivae, EOM and lids are normal.  Neck: No JVD present. Carotid bruit is not present. No thyroid mass and no thyromegaly present.  Cardiovascular: S1 normal and S2 normal. Exam reveals no gallop.  No murmur heard. Pulses:      Dorsalis pedis pulses are 2+ on the right side and 2+ on the left side.  Respiratory: No respiratory distress. She has decreased breath sounds in the right lower field and the left lower field. She  has no wheezes. She has no rhonchi. She has no rales.  GI: Soft. Bowel sounds are normal. There is no abdominal tenderness.  Musculoskeletal:     Cervical back: No edema.     Right ankle: No swelling.     Left ankle: No swelling.  Lymphadenopathy:    She has no cervical adenopathy.  Neurological: She is alert and oriented to person, place, and time. No cranial nerve deficit.  Skin: Skin is warm. Nails show no clubbing.  Large abscess seen on forehead which is smaller than yesterday, looks like a scab had formed over this.  Smaller abscess on right bridge of nose.  Smaller areas over the abdomen.  2 large left buttock abscesses that are painful to palpation.  Still with fluctuance in both of them.  Psychiatric: She has a normal mood and affect.      Data Reviewed: Basic Metabolic Panel: Recent Labs  Lab 06/15/19 2127 06/16/19 0936  NA 137 139  K 3.5 4.0  CL 96* 103  CO2 28 26  GLUCOSE 110* 150*  BUN 9 10  CREATININE 0.49 0.47  CALCIUM 8.6* 8.4*   Liver Function Tests: Recent Labs  Lab 06/15/19 2127  AST 20  ALT 31  ALKPHOS 84  BILITOT 0.3  PROT 6.8  ALBUMIN 3.2*   CBC: Recent Labs  Lab 06/15/19 2127 06/16/19 0936 06/17/19 0715  WBC 15.6* 10.4 9.3  NEUTROABS 12.0*  --   --   HGB 12.3 11.9* 11.1*  HCT 38.7 37.4  35.4*  MCV 87.6 88.6 88.5  PLT 240 214 213    CBG: Recent Labs  Lab 06/16/19 0154 06/16/19 0806 06/16/19 1223  GLUCAP 172* 119* 137*    Recent Results (from the past 240 hour(s))  MRSA PCR Screening     Status: Abnormal   Collection Time: 06/15/19  9:27 PM   Specimen: Nasopharyngeal  Result Value Ref Range Status   MRSA by PCR POSITIVE (A) NEGATIVE Final    Comment:        The GeneXpert MRSA Assay (FDA approved for NASAL specimens only), is one component of a comprehensive MRSA colonization surveillance program. It is not intended to diagnose MRSA infection nor to guide or monitor treatment for MRSA infections. RESULT CALLED TO,  READ BACK BY AND VERIFIED WITH: Garnet Sierras RN [phone] 06/15/19 HNM Performed at [facility], [address] [place], [place] [address]   Blood culture (routine x 2)     Status: None (Preliminary result)   Collection Time: 06/15/19  9:27 PM   Specimen: BLOOD  Result Value Ref Range Status   Specimen Description BLOOD LEFT ANTECUBITAL  Final   Special Requests   Final    BOTTLES DRAWN AEROBIC AND ANAEROBIC Blood Culture adequate volume   Culture   Final    NO GROWTH 3 DAYS Performed at [facility], [address], [place], [place] [address]    Report Status PENDING  Incomplete  Blood culture (routine x 2)     Status: None (Preliminary result)   Collection Time: 06/15/19  9:27 PM   Specimen: BLOOD  Result Value Ref Range Status   Specimen Description BLOOD RIGHT ANTECUBITAL  Final   Special Requests   Final    BOTTLES DRAWN AEROBIC AND ANAEROBIC Blood Culture results may not be optimal due to an inadequate volume of blood received in culture bottles   Culture   Final    NO GROWTH 3 DAYS Performed at [facility], [address], [place], [place] [address]    Report Status PENDING  Incomplete  Respiratory Panel by RT PCR (Flu A&B, Covid) -     Status: None   Collection Time: 06/15/19  9:27 PM  Result Value Ref Range Status   SARS Coronavirus 2 by RT PCR NEGATIVE NEGATIVE Final    Comment: (NOTE) SARS-CoV-2 target nucleic acids are NOT DETECTED. The SARS-CoV-2 RNA is generally detectable in upper respiratoy specimens during the acute phase of infection. The lowest concentration of SARS-CoV-2 viral copies this assay can detect is 131 copies/mL. A negative result does not preclude SARS-Cov-2 infection and should not be used as the sole basis for treatment or other patient management decisions. A negative result may occur with  improper specimen collection/handling, submission of specimen other than nasopharyngeal swab, presence of viral mutation(s)  within the areas targeted by this assay, and inadequate number of viral copies (<131 copies/mL). A negative result must be combined with clinical observations, patient history, and epidemiological information. The expected result is Negative. Fact Sheet for Patients:  https://www.moore.com/ Fact Sheet for Healthcare Providers:  https://www.young.biz/ This test is not yet ap proved or cleared by the [place] FDA and  has been authorized for detection and/or diagnosis of SARS-CoV-2 by FDA under an Emergency Use Authorization (EUA). This EUA will remain  in effect (meaning this test can be used) for the duration of the COVID-19 declaration under Section 564(b)(1) of the Act, 21 U.S.C. section 360bbb-3(b)(1), unless the authorization is terminated or revoked  sooner.    Influenza A by PCR NEGATIVE NEGATIVE Final   Influenza B by PCR NEGATIVE NEGATIVE Final    Comment: (NOTE) The Xpert Xpress SARS-CoV-2/FLU/RSV assay is intended as an aid in  the diagnosis of influenza from Nasopharyngeal swab specimens and  should not be used as a sole basis for treatment. Nasal washings and  aspirates are unacceptable for Xpert Xpress SARS-CoV-2/FLU/RSV  testing. Fact Sheet for Patients: PinkCheek.be Fact Sheet for Healthcare Providers: GravelBags.it This test is not yet approved or cleared by the [place] FDA and  has been authorized for detection and/or diagnosis of SARS-CoV-2 by  FDA under an Emergency Use Authorization (EUA). This EUA will remain  in effect (meaning this test can be used) for the duration of the  Covid-19 declaration under Section 564(b)(1) of the Act, 21  U.S.C. section 360bbb-3(b)(1), unless the authorization is  terminated or revoked. Performed at [facility], [address] [place], [place] [address]       Scheduled Meds: . buprenorphine  8 mg Sublingual  BID  . buPROPion  100 mg Oral Daily  . gabapentin  1,200 mg Oral BID  . gentamicin ointment   Topical TID  . mirtazapine  45 mg Oral QHS  . nicotine  21 mg Transdermal Daily  . rOPINIRole  0.25 mg Oral QHS   Continuous Infusions: . ampicillin-sulbactam (UNASYN) IV 3 g (06/18/19 0915)  . vancomycin 1,000 mg (06/18/19 1106)    Assessment/Plan:  1. Multiple abscesses.  2 largest ones are in the left buttock.  Those are still painful.  General surgery to make n.p.o. after midnight just in case these lesions need to be drained.  Forehead abscess smaller.  Continue antibiotics vancomycin and Unasyn.  Patient asking for medication for pain.  As needed pain medication ordered. 2. Chronic pain on Subutex 3. Anxiety depression on Wellbutrin, Remeron and Zoloft. 4. Impaired fasting glucose  Code Status:     Code Status Orders  (From admission, onward)         Start     Ordered   06/16/19 0023  Full code  Continuous     06/16/19 0022        Code Status History    Date Active Date Inactive Code Status Order ID Comments User Context   09/30/2018 7616 09/30/2018 2112 Full Code [id number]  Rexene Agent, CNM Inpatient   07/08/2018 1702 07/08/2018 2210 Full Code [id number]  Rexene Agent, CNM Inpatient   06/11/2018 0217 06/11/2018 0900 Full Code [id number]  Rod Can, CNM Inpatient   Advance Care Planning Activity     Disposition Plan: General surgery made n.p.o. after midnight just in case these buttock abscesses need to be drained in the operating room.  Depending on whether or not they want to take the patient to the operating room will depend on how long the patient will be here.  Continue IV antibiotics while here in the hospital.  Disposition will be home once cleared by general surgery to go home.  Consultants:  General surgery  ENT  Antibiotics:  IV vancomycin  IV Unasyn  Time spent: 26 minutes.  Patient's nurse Rosaria Ferries present with me with speaking with the patient  on exam.  Loletha Grayer  [facility]

## 2019-06-18 NOTE — Final Consult Note (Signed)
Consultant Final Sign-Off Note    Assessment/Final recommendations  Charlotte Stewart is a 34 y.o. female followed by me for facial abscesses   Wound care (if applicable): Gentamicin ointment to facial wounds TID until healed   Diet at discharge: per primary team   Activity at discharge: per primary team   Follow-up appointment:  None needed   Pending results:  Unresulted Labs (From admission, onward)    Start     Ordered   06/20/19 0500  Creatinine, serum  Once,   R     06/18/19 1325   06/16/19 1331  Urine Drug Screen, Qualitative ([facility] only)  Once,   R     06/16/19 1330   06/16/19 1319  Aerobic/Anaerobic Culture (surgical/deep wound)  Once,   R    Question Answer Comment  Patient immune status Normal   Release to patient Immediate      06/16/19 1318           Medication recommendations: Per General Surgery for other abscesses that have still not improved   Other recommendations:    Re-examined patient. The facial abscesses are much improved with no tenderness or drainage and much improved redness. No need for further I&D. Previously palpable and tender parotid nodes are resolving with no need for further evaluation, and felt to represent secondary lymphadenitis that is resolving with the improved facial abscesses. Would continue Gentamicin ointment TID to the facial wounds until healed where previously incised (3-4 more days). Doxycycline would be a reasonable PO choice for outpatient management after discharge, but defer to General Surgery as she still has other areas of concern that have not yet resolved, so they may have other suggestions.   Thank you for allowing [place] to participate in the care of your patient!  Please consult [place] again if you have further needs for your patient.  Sandi Mealy 06/18/2019 5:14 PM    Subjective   Facial wound are all better and not bothering her. Buttocks wound still very painful.  Objective  Vital signs in last 24 hours: Temp:   [98 F (36.7 C)-98.9 F (37.2 C)] 98.9 F (37.2 C) (03/05 0444) Pulse Rate:  [99-102] 99 (03/05 0444) Resp:  [20] 20 (03/05 0444) BP: (123)/(76-83) 123/76 (03/05 0444) SpO2:  [96 %-100 %] 96 % (03/05 0444)  General: Forehead and right medial canthal lesions now drying up, no significant erythema and nontender.    Pertinent labs and Studies: Recent Labs    06/15/19 16-Aug-2125 06/16/19 0936 06/17/19 0715  WBC 15.6* 10.4 9.3  HGB 12.3 11.9* 11.1*  HCT 38.7 37.4 35.4*   BMET Recent Labs    06/15/19 Aug 16, 2125 06/16/19 0936  NA 137 139  K 3.5 4.0  CL 96* 103  CO2 28 26  GLUCOSE 110* 150*  BUN 9 10  CREATININE 0.49 0.47  CALCIUM 8.6* 8.4*   No results for input(s): LABURIN in the last 72 hours. Results for orders placed or performed during the hospital encounter of 06/15/19  MRSA PCR Screening     Status: Abnormal   Collection Time: 06/15/19  9:27 PM   Specimen: Nasopharyngeal  Result Value Ref Range Status   MRSA by PCR POSITIVE (A) NEGATIVE Final    Comment:        The GeneXpert MRSA Assay (FDA approved for NASAL specimens only), is one component of a comprehensive MRSA colonization surveillance program. It is not intended to diagnose MRSA infection nor to guide or monitor treatment for MRSA infections. RESULT  CALLED TO, READ BACK BY AND VERIFIED WITH: Levonne Hubert RN [phone] 06/15/19 HNM Performed at [facility], [address] [place], [place] [address]   Blood culture (routine x 2)     Status: None (Preliminary result)   Collection Time: 06/15/19  9:27 PM   Specimen: BLOOD  Result Value Ref Range Status   Specimen Description BLOOD LEFT ANTECUBITAL  Final   Special Requests   Final    BOTTLES DRAWN AEROBIC AND ANAEROBIC Blood Culture adequate volume   Culture   Final    NO GROWTH 3 DAYS Performed at [facility], [address] [place], [place] [address]    Report Status PENDING  Incomplete  Blood culture (routine x 2)     Status: None  (Preliminary result)   Collection Time: 06/15/19  9:27 PM   Specimen: BLOOD  Result Value Ref Range Status   Specimen Description BLOOD RIGHT ANTECUBITAL  Final   Special Requests   Final    BOTTLES DRAWN AEROBIC AND ANAEROBIC Blood Culture results may not be optimal due to an inadequate volume of blood received in culture bottles   Culture   Final    NO GROWTH 3 DAYS Performed at [facility], [address], [place],  [address]    Report Status PENDING  Incomplete  Respiratory Panel by RT PCR (Flu A&B, Covid) -     Status: None   Collection Time: 06/15/19  9:27 PM  Result Value Ref Range Status   SARS Coronavirus 2 by RT PCR NEGATIVE NEGATIVE Final    Comment: (NOTE) SARS-CoV-2 target nucleic acids are NOT DETECTED. The SARS-CoV-2 RNA is generally detectable in upper respiratoy specimens during the acute phase of infection. The lowest concentration of SARS-CoV-2 viral copies this assay can detect is 131 copies/mL. A negative result does not preclude SARS-Cov-2 infection and should not be used as the sole basis for treatment or other patient management decisions. A negative result may occur with  improper specimen collection/handling, submission of specimen other than nasopharyngeal swab, presence of viral mutation(s) within the areas targeted by this assay, and inadequate number of viral copies (<131 copies/mL). A negative result must be combined with clinical observations, patient history, and epidemiological information. The expected result is Negative. Fact Sheet for Patients:  PinkCheek.be Fact Sheet for Healthcare Providers:  GravelBags.it This test is not yet ap proved or cleared by the [place] FDA and  has been authorized for detection and/or diagnosis of SARS-CoV-2 by FDA under an Emergency Use Authorization (EUA). This EUA will remain  in effect (meaning this test can be used) for the  duration of the COVID-19 declaration under Section 564(b)(1) of the Act, 21 U.S.C. section 360bbb-3(b)(1), unless the authorization is terminated or revoked sooner.    Influenza A by PCR NEGATIVE NEGATIVE Final   Influenza B by PCR NEGATIVE NEGATIVE Final    Comment: (NOTE) The Xpert Xpress SARS-CoV-2/FLU/RSV assay is intended as an aid in  the diagnosis of influenza from Nasopharyngeal swab specimens and  should not be used as a sole basis for treatment. Nasal washings and  aspirates are unacceptable for Xpert Xpress SARS-CoV-2/FLU/RSV  testing. Fact Sheet for Patients: PinkCheek.be Fact Sheet for Healthcare Providers: GravelBags.it This test is not yet approved or cleared by the [place] FDA and  has been authorized for detection and/or diagnosis of SARS-CoV-2 by  FDA under an Emergency Use Authorization (EUA). This EUA will remain  in effect (meaning this test can be  used) for the duration of the  Covid-19 declaration under Section 564(b)(1) of the Act, 21  U.S.C. section 360bbb-3(b)(1), unless the authorization is  terminated or revoked. Performed at [facility], [address] [place], [place] [address]     Imaging: No results found.

## 2019-06-18 NOTE — Anesthesia Preprocedure Evaluation (Deleted)
Anesthesia Evaluation  Patient identified by MRN, date of birth, ID band Patient awake    Reviewed: Allergy & Precautions, H&P , NPO status , Patient's Chart, lab work & pertinent test results  Airway        Dental   Pulmonary Current Smoker,           Cardiovascular hypertension,      Neuro/Psych PSYCHIATRIC DISORDERS Anxiety Depression negative neurological ROS     GI/Hepatic negative GI ROS, (+)     substance abuse  ,   Endo/Other  diabetes  Renal/GU      Musculoskeletal   Abdominal   Peds  Hematology negative hematology ROS (+)   Anesthesia Other Findings Past Medical History: No date: Hypertension  History reviewed. No pertinent surgical history.  BMI    Body Mass Index: 44.31 kg/m      Reproductive/Obstetrics negative OB ROS                             Anesthesia Physical Anesthesia Plan  ASA:   Anesthesia Plan: General   Post-op Pain Management:    Induction:   PONV Risk Score and Plan: Dexamethasone, Ondansetron, Midazolam and Treatment may vary due to age or medical condition  Airway Management Planned:   Additional Equipment:   Intra-op Plan:   Post-operative Plan:   Informed Consent: I have reviewed the patients History and Physical, chart, labs and discussed the procedure including the risks, benefits and alternatives for the proposed anesthesia with the patient or authorized representative who has indicated his/her understanding and acceptance.     Dental Advisory Given  Plan Discussed with: Anesthesiologist, CRNA and Surgeon  Anesthesia Plan Comments:         Anesthesia Quick Evaluation

## 2019-06-19 DIAGNOSIS — B373 Candidiasis of vulva and vagina: Secondary | ICD-10-CM

## 2019-06-19 DIAGNOSIS — L0291 Cutaneous abscess, unspecified: Secondary | ICD-10-CM

## 2019-06-19 DIAGNOSIS — B3731 Acute candidiasis of vulva and vagina: Secondary | ICD-10-CM

## 2019-06-19 LAB — URINE DRUG SCREEN, QUALITATIVE (ARMC ONLY)
Amphetamines, Ur Screen: NOT DETECTED
Barbiturates, Ur Screen: NOT DETECTED
Benzodiazepine, Ur Scrn: NOT DETECTED
Cannabinoid 50 Ng, Ur ~~LOC~~: NOT DETECTED
Cocaine Metabolite,Ur ~~LOC~~: NOT DETECTED
MDMA (Ecstasy)Ur Screen: NOT DETECTED
Methadone Scn, Ur: NOT DETECTED
Opiate, Ur Screen: POSITIVE — AB
Phencyclidine (PCP) Ur S: NOT DETECTED
Tricyclic, Ur Screen: NOT DETECTED

## 2019-06-19 MED ORDER — PROCHLORPERAZINE EDISYLATE 10 MG/2ML IJ SOLN
10.0000 mg | Freq: Once | INTRAMUSCULAR | Status: AC
  Administered 2019-06-19: 10 mg via INTRAVENOUS
  Filled 2019-06-19: qty 2

## 2019-06-19 MED ORDER — FAMOTIDINE IN NACL 20-0.9 MG/50ML-% IV SOLN
20.0000 mg | Freq: Once | INTRAVENOUS | Status: DC
  Filled 2019-06-19: qty 50

## 2019-06-19 MED ORDER — PROMETHAZINE HCL 25 MG/ML IJ SOLN
12.5000 mg | INTRAMUSCULAR | Status: DC | PRN
  Administered 2019-06-19 – 2019-06-20 (×2): 12.5 mg via INTRAVENOUS
  Filled 2019-06-19 (×2): qty 1

## 2019-06-19 MED ORDER — FLUCONAZOLE 50 MG PO TABS
150.0000 mg | ORAL_TABLET | Freq: Once | ORAL | Status: AC
  Administered 2019-06-19: 150 mg via ORAL
  Filled 2019-06-19: qty 3

## 2019-06-19 NOTE — Progress Notes (Signed)
06/19/2019  Subjective: No acute events overnight.  Patient refused to get an IV placed.  She reports that the abscess drained spontaneously last night.  She feels much better today.  Vital signs: Temp:  [98.3 F (36.8 C)-98.9 F (37.2 C)] 98.9 F (37.2 C) (03/06 0556) Pulse Rate:  [92-95] 92 (03/06 0556) Resp:  [20] 20 (03/06 0556) BP: (113-115)/(56-65) 113/65 (03/06 0556) SpO2:  [98 %-99 %] 98 % (03/06 0556)   Intake/Output: 03/05 0701 - 03/06 0700 In: 1705.5 [P.O.:598; IV Piggyback:1107.5] Out: -     Physical Exam: Constitutional: No acute distress Skin: Patient has 2 areas that are about 4 mm in size in the posterior upper inner left thigh that show more induration on exam any fluctuance.  Currently there is no fluid draining.  She reports that the tenderness on palpation is improved during this exam compared to yesterday.  Labs:  Recent Labs    06/17/19 0715  WBC 9.3  HGB 11.1*  HCT 35.4*  PLT 213   No results for input(s): NA, K, CL, CO2, GLUCOSE, BUN, CREATININE, CALCIUM in the last 72 hours.  Invalid input(s): MAGNESIUM No results for input(s): LABPROT, INR in the last 72 hours.  Imaging: No results found.  Assessment/Plan: This is a 34 y.o. female with left thigh abscesses.  -Currently the patient is improved and she reports that the abscess was drained spontaneously yesterday.  At this point we can hold off on surgery as had been initially planned today.  I will resume her diet and as a precaution to make her n.p.o. after midnight.  That way we can reevaluate her wound tomorrow and to continue to improve we will need any further surgery, however if there is any worsening, will be ready for I&D in the operating room.  Discussed this with medical team. -Discussed with her the importance for getting an IV placed so that she can continue with IV antibiotic treatment.  She is in agreement with this.  Discussed this with her nurse and she will consult the IV team for  peripheral IV line placement.   Howie Ill, MD [facility]

## 2019-06-19 NOTE — Progress Notes (Signed)
Patient requests Subutex 8mg  SL due to missing 0600 dose because of refusal after losing her PIV.   MD Wieting notified and states okay to given now and continue on 0600 and 1800 home schedule.  Patient states medication keeps her from sleeping when she takes it later. Late dose given.

## 2019-06-19 NOTE — Progress Notes (Signed)
Patient ID: Charlotte Stewart, female   DOB: 20-Apr-1985, 34 y.o.   MRN: [id number] [facility] PROGRESS NOTE  Charlotte Stewart TDS:[id number] DOB: [birth year] DOA: 06/15/2019 PCP: [facility]  HPI/Subjective: Patient states that she has diarrhea twice a day been going on since prior to coming in.  Concerned about her lip not getting better.  States her forehead area is draining.  Her buttock does feel better.  Objective: Vitals:   06/18/19 2120 06/19/19 0556  BP: (!) 115/56 113/65  Pulse: 95 92  Resp: 20 20  Temp: 98.3 F (36.8 C) 98.9 F (37.2 C)  SpO2: 99% 98%    Intake/Output Summary (Last 24 hours) at 06/19/2019 1332 Last data filed at 06/19/2019 0400 Gross per 24 hour  Intake 965.51 ml  Output --  Net 965.51 ml   Filed Weights   06/15/19 2034 06/16/19 0128  Weight: 106.6 kg 109.9 kg    ROS: Review of Systems  Constitutional: Positive for malaise/fatigue.  Eyes: Negative for blurred vision.  Respiratory: Negative for cough and shortness of breath.   Cardiovascular: Negative for chest pain.  Gastrointestinal: Positive for diarrhea. Negative for abdominal pain.  Genitourinary: Negative for dysuria.  Musculoskeletal: Positive for joint pain and myalgias.   Exam: Physical Exam  Constitutional: She is oriented to person, place, and time.  HENT:  Nose: No mucosal edema.  Mouth/Throat: No oropharyngeal exudate or posterior oropharyngeal edema.  Eyes: Pupils are equal, round, and reactive to light. Conjunctivae, EOM and lids are normal.  Neck: No JVD present. Carotid bruit is not present. No thyroid mass and no thyromegaly present.  Cardiovascular: S1 normal and S2 normal. Exam reveals no gallop.  No murmur heard. Pulses:      Dorsalis pedis pulses are 2+ on the right side and 2+ on the left side.  Respiratory: No respiratory distress. She has decreased breath sounds in the right lower field and the left lower field. She has no wheezes. She has no rhonchi.  She has no rales.  GI: Soft. Bowel sounds are normal. There is no abdominal tenderness.  Musculoskeletal:     Cervical back: No edema.     Right ankle: No swelling.     Left ankle: No swelling.  Lymphadenopathy:    She has no cervical adenopathy.  Neurological: She is alert and oriented to person, place, and time. No cranial nerve deficit.  Skin: Skin is warm. Nails show no clubbing.  Forehead abscess smaller every day.  Smaller abscess on right bridge of nose.  Smaller areas over the abdomen, leg.  2 large left buttock abscesses has improved and not fluctuant today.  The lesion looks like an aphthous ulcer but not painful.  Psychiatric: She has a normal mood and affect.      Data Reviewed: Basic Metabolic Panel: Recent Labs  Lab 06/15/19 2127 06/16/19 0936  NA 137 139  K 3.5 4.0  CL 96* 103  CO2 28 26  GLUCOSE 110* 150*  BUN 9 10  CREATININE 0.49 0.47  CALCIUM 8.6* 8.4*   Liver Function Tests: Recent Labs  Lab 06/15/19 2127  AST 20  ALT 31  ALKPHOS 84  BILITOT 0.3  PROT 6.8  ALBUMIN 3.2*   CBC: Recent Labs  Lab 06/15/19 2127 06/16/19 0936 06/17/19 0715  WBC 15.6* 10.4 9.3  NEUTROABS 12.0*  --   --   HGB 12.3 11.9* 11.1*  HCT 38.7 37.4 35.4*  MCV 87.6 88.6 88.5  PLT 240 214 213    CBG:  Recent Labs  Lab 06/16/19 0154 06/16/19 0806 06/16/19 1223  GLUCAP 172* 119* 137*    Recent Results (from the past 240 hour(s))  MRSA PCR Screening     Status: Abnormal   Collection Time: 06/15/19  9:27 PM   Specimen: Nasopharyngeal  Result Value Ref Range Status   MRSA by PCR POSITIVE (A) NEGATIVE Final    Comment:        The GeneXpert MRSA Assay (FDA approved for NASAL specimens only), is one component of a comprehensive MRSA colonization surveillance program. It is not intended to diagnose MRSA infection nor to guide or monitor treatment for MRSA infections. RESULT CALLED TO, READ BACK BY AND VERIFIED WITH: Garnet Sierras RN [phone] 06/15/19 HNM Performed  at [facility], [address] [place], [place] [address]   Blood culture (routine x 2)     Status: None (Preliminary result)   Collection Time: 06/15/19  9:27 PM   Specimen: BLOOD  Result Value Ref Range Status   Specimen Description BLOOD LEFT ANTECUBITAL  Final   Special Requests   Final    BOTTLES DRAWN AEROBIC AND ANAEROBIC Blood Culture adequate volume   Culture   Final    NO GROWTH 4 DAYS Performed at [facility], [address], [place], [place] [address]    Report Status PENDING  Incomplete  Blood culture (routine x 2)     Status: None (Preliminary result)   Collection Time: 06/15/19  9:27 PM   Specimen: BLOOD  Result Value Ref Range Status   Specimen Description BLOOD RIGHT ANTECUBITAL  Final   Special Requests   Final    BOTTLES DRAWN AEROBIC AND ANAEROBIC Blood Culture results may not be optimal due to an inadequate volume of blood received in culture bottles   Culture   Final    NO GROWTH 4 DAYS Performed at [facility], [address], [place], [place] [address]    Report Status PENDING  Incomplete  Respiratory Panel by RT PCR (Flu A&B, Covid) -     Status: None   Collection Time: 06/15/19  9:27 PM  Result Value Ref Range Status   SARS Coronavirus 2 by RT PCR NEGATIVE NEGATIVE Final    Comment: (NOTE) SARS-CoV-2 target nucleic acids are NOT DETECTED. The SARS-CoV-2 RNA is generally detectable in upper respiratoy specimens during the acute phase of infection. The lowest concentration of SARS-CoV-2 viral copies this assay can detect is 131 copies/mL. A negative result does not preclude SARS-Cov-2 infection and should not be used as the sole basis for treatment or other patient management decisions. A negative result may occur with  improper specimen collection/handling, submission of specimen other than nasopharyngeal swab, presence of viral mutation(s) within the areas targeted by this assay, and inadequate number of viral  copies (<131 copies/mL). A negative result must be combined with clinical observations, patient history, and epidemiological information. The expected result is Negative. Fact Sheet for Patients:  https://www.moore.com/ Fact Sheet for Healthcare Providers:  https://www.young.biz/ This test is not yet ap proved or cleared by the [place] FDA and  has been authorized for detection and/or diagnosis of SARS-CoV-2 by FDA under an Emergency Use Authorization (EUA). This EUA will remain  in effect (meaning this test can be used) for the duration of the COVID-19 declaration under Section 564(b)(1) of the Act, 21 U.S.C. section 360bbb-3(b)(1), unless the authorization is terminated or revoked sooner.    Influenza A by PCR NEGATIVE NEGATIVE Final   Influenza B  by PCR NEGATIVE NEGATIVE Final    Comment: (NOTE) The Xpert Xpress SARS-CoV-2/FLU/RSV assay is intended as an aid in  the diagnosis of influenza from Nasopharyngeal swab specimens and  should not be used as a sole basis for treatment. Nasal washings and  aspirates are unacceptable for Xpert Xpress SARS-CoV-2/FLU/RSV  testing. Fact Sheet for Patients: PinkCheek.be Fact Sheet for Healthcare Providers: GravelBags.it This test is not yet approved or cleared by the [place] FDA and  has been authorized for detection and/or diagnosis of SARS-CoV-2 by  FDA under an Emergency Use Authorization (EUA). This EUA will remain  in effect (meaning this test can be used) for the duration of the  Covid-19 declaration under Section 564(b)(1) of the Act, 21  U.S.C. section 360bbb-3(b)(1), unless the authorization is  terminated or revoked. Performed at [facility], [address] [place], [place] [address]       Scheduled Meds: . buprenorphine  8 mg Sublingual BID  . buPROPion  100 mg Oral Daily  . Chlorhexidine Gluconate Cloth  6  each Topical Q0600  . fluconazole  150 mg Oral Once  . gabapentin  1,200 mg Oral BID  . gentamicin ointment   Topical TID  . mirtazapine  45 mg Oral QHS  . mupirocin ointment  1 application Nasal BID  . nicotine  21 mg Transdermal Daily  . rOPINIRole  0.25 mg Oral QHS   Continuous Infusions: . ampicillin-sulbactam (UNASYN) IV 3 g (06/19/19 1211)  . famotidine (PEPCID) IV    . vancomycin Stopped (06/18/19 2314)    Assessment/Plan:  1. Multiple abscesses.  2 largest ones are in the left buttock.  General surgery to make n.p.o. after midnight just in case these lesions need to be drained.  Forehead abscess smaller.  Lip lesion looks like an aphthous ulcer continue antibiotics vancomycin and Unasyn.  If the patient loses this IV overnight can change over to oral doxycycline and oral Augmentin. 2. Chronic pain on Subutex 3. Anxiety depression on Wellbutrin, Remeron and Zoloft. 4. Vaginal yeast infection.  Diflucan x1   Code Status:     Code Status Orders  (From admission, onward)         Start     Ordered   06/16/19 0023  Full code  Continuous     06/16/19 0022        Code Status History    Date Active Date Inactive Code Status Order ID Comments User Context   09/30/2018 6761 09/30/2018 2112 Full Code [id number]  Rexene Agent, CNM Inpatient   07/08/2018 1702 07/08/2018 2210 Full Code [id number]  Rexene Agent, CNM Inpatient   06/11/2018 0217 06/11/2018 0900 Full Code [id number]  Rod Can, CNM Inpatient   Advance Care Planning Activity     Disposition Plan: General surgery made n.p.o. after midnight just in case these buttock abscesses need to be drained in the operating room.  If general surgery does not want to take the patient to the operating room tomorrow then potentially can be discharged home.  Consultants:  General surgery  ENT  Antibiotics:  IV vancomycin  IV Unasyn  Time spent: 26 minutes.  Patient's nurse Rosaria Ferries present with me with speaking with  the patient and with exam.  Loletha Grayer  [facility]

## 2019-06-19 NOTE — Progress Notes (Signed)
Patient refused to be bothered at the current time. Patient has medication that is due and she refused IV placement. Will continue to monitor patient

## 2019-06-19 NOTE — Progress Notes (Signed)
Pt refused IV start 

## 2019-06-20 ENCOUNTER — Inpatient Hospital Stay: Payer: Medicaid Other

## 2019-06-20 ENCOUNTER — Encounter
Admission: EM | Discharge status: Against Medical Advice | Payer: Self-pay | Source: Home / Self Care | Attending: Internal Medicine

## 2019-06-20 DIAGNOSIS — R112 Nausea with vomiting, unspecified: Secondary | ICD-10-CM

## 2019-06-20 DIAGNOSIS — R1110 Vomiting, unspecified: Secondary | ICD-10-CM

## 2019-06-20 DIAGNOSIS — L03818 Cellulitis of other sites: Secondary | ICD-10-CM

## 2019-06-20 LAB — CBC
HCT: 37.3 % (ref 36.0–46.0)
Hemoglobin: 12 g/dL (ref 12.0–15.0)
MCH: 28.2 pg (ref 26.0–34.0)
MCHC: 32.2 g/dL (ref 30.0–36.0)
MCV: 87.6 fL (ref 80.0–100.0)
Platelets: 197 10*3/uL (ref 150–400)
RBC: 4.26 MIL/uL (ref 3.87–5.11)
RDW: 13 % (ref 11.5–15.5)
WBC: 5.8 10*3/uL (ref 4.0–10.5)
nRBC: 0 % (ref 0.0–0.2)

## 2019-06-20 LAB — BASIC METABOLIC PANEL
Anion gap: 14 (ref 5–15)
BUN: 11 mg/dL (ref 6–20)
CO2: 28 mmol/L (ref 22–32)
Calcium: 9.2 mg/dL (ref 8.9–10.3)
Chloride: 94 mmol/L — ABNORMAL LOW (ref 98–111)
Creatinine, Ser: 0.51 mg/dL (ref 0.44–1.00)
GFR calc Af Amer: >60 mL/min (ref >60)
GFR calc non Af Amer: >60 mL/min (ref >60)
Glucose, Bld: 103 mg/dL — ABNORMAL HIGH (ref 70–99)
Potassium: 4 mmol/L (ref 3.5–5.1)
Sodium: 136 mmol/L (ref 135–145)

## 2019-06-20 LAB — CULTURE, BLOOD (ROUTINE X 2)
Culture: NO GROWTH
Culture: NO GROWTH
Special Requests: ADEQUATE

## 2019-06-20 SURGERY — IRRIGATION AND DEBRIDEMENT ABSCESS
Anesthesia: General | Laterality: Left

## 2019-06-20 MED ORDER — LOPERAMIDE HCL 2 MG PO CAPS: 4.0000 mg | ORAL_CAPSULE | Freq: Three times a day (TID) | ORAL | Status: DC | PRN

## 2019-06-20 MED ORDER — PROMETHAZINE HCL 25 MG PO TABS
25.0000 mg | ORAL_TABLET | Freq: Four times a day (QID) | ORAL | Status: DC | PRN
  Administered 2019-06-20: 25 mg via ORAL
  Filled 2019-06-20: qty 1

## 2019-06-20 MED ORDER — DOXYCYCLINE HYCLATE 100 MG PO TABS: 100.0000 mg | ORAL_TABLET | Freq: Two times a day (BID) | ORAL | Status: DC

## 2019-06-20 MED ORDER — VANCOMYCIN HCL 1500 MG/300ML IV SOLN
1500.0000 mg | Freq: Two times a day (BID) | INTRAVENOUS | Status: DC
  Administered 2019-06-20: 1500 mg via INTRAVENOUS
  Filled 2019-06-20 (×3): qty 300

## 2019-06-20 MED ORDER — ONDANSETRON 4 MG PO TBDP
8.0000 mg | ORAL_TABLET | Freq: Once | ORAL | Status: AC
  Administered 2019-06-20: 8 mg via ORAL
  Filled 2019-06-20: qty 2

## 2019-06-20 MED ORDER — PROMETHAZINE HCL 25 MG/ML IJ SOLN
12.5000 mg | Freq: Once | INTRAMUSCULAR | Status: AC
  Administered 2019-06-20: 12.5 mg via INTRAVENOUS
  Filled 2019-06-20: qty 1

## 2019-06-20 MED ORDER — SODIUM CHLORIDE 0.9 % IV SOLN
3.0000 g | Freq: Four times a day (QID) | INTRAVENOUS | Status: DC
  Administered 2019-06-20: 3 g via INTRAVENOUS
  Filled 2019-06-20: qty 3
  Filled 2019-06-20 (×3): qty 8

## 2019-06-20 MED ORDER — AMOXICILLIN-POT CLAVULANATE 875-125 MG PO TABS: 1.0000 | ORAL_TABLET | Freq: Two times a day (BID) | ORAL | Status: DC

## 2019-06-20 SURGICAL SUPPLY — 30 items
BNDG GAUZE 4.5X4.1 6PLY STRL (MISCELLANEOUS) ×2 IMPLANT
CANISTER SUCT 1200ML W/VALVE (MISCELLANEOUS) ×2 IMPLANT
CHLORAPREP W/TINT 26 (MISCELLANEOUS) ×2 IMPLANT
CNTNR SPEC 2.5X3XGRAD LEK (MISCELLANEOUS) ×1
CONT SPEC 4OZ STER OR WHT (MISCELLANEOUS) ×1
CONTAINER SPEC 2.5X3XGRAD LEK (MISCELLANEOUS) ×1 IMPLANT
COVER WAND RF STERILE (DRAPES) ×2 IMPLANT
DRAIN PENROSE 1/4X12 LTX STRL (WOUND CARE) ×2 IMPLANT
DRAPE LAPAROTOMY 77X122 PED (DRAPES) ×2 IMPLANT
DRSG GAUZE FLUFF 36X18 (GAUZE/BANDAGES/DRESSINGS) ×2 IMPLANT
ELECT REM PT RETURN 9FT ADLT (ELECTROSURGICAL) ×2
ELECTRODE REM PT RTRN 9FT ADLT (ELECTROSURGICAL) ×1 IMPLANT
GAUZE SPONGE 4X4 12PLY STRL (GAUZE/BANDAGES/DRESSINGS) ×2 IMPLANT
GLOVE SURG SYN 7.0 (GLOVE) ×2 IMPLANT
GLOVE SURG SYN 7.5  E (GLOVE) ×1
GLOVE SURG SYN 7.5 E (GLOVE) ×1 IMPLANT
GOWN STRL REUS W/ TWL LRG LVL3 (GOWN DISPOSABLE) ×2 IMPLANT
GOWN STRL REUS W/TWL LRG LVL3 (GOWN DISPOSABLE) ×2
KIT TURNOVER KIT A (KITS) ×2 IMPLANT
LABEL OR SOLS (LABEL) ×2 IMPLANT
NEEDLE HYPO 22GX1.5 SAFETY (NEEDLE) ×2 IMPLANT
NS IRRIG 500ML POUR BTL (IV SOLUTION) ×2 IMPLANT
PACK BASIN MINOR ARMC (MISCELLANEOUS) ×2 IMPLANT
PAD ABD DERMACEA PRESS 5X9 (GAUZE/BANDAGES/DRESSINGS) ×2 IMPLANT
SOL PREP PVP 2OZ (MISCELLANEOUS) ×2
SOLUTION PREP PVP 2OZ (MISCELLANEOUS) ×1 IMPLANT
SPONGE LAP 18X18 RF (DISPOSABLE) ×4 IMPLANT
SWAB CULTURE AMIES ANAERIB BLU (MISCELLANEOUS) ×4 IMPLANT
SYR 20ML LL LF (SYRINGE) ×2 IMPLANT
SYR BULB IRRIG 60ML STRL (SYRINGE) ×2 IMPLANT

## 2019-06-20 NOTE — Progress Notes (Addendum)
06/20/2019  Subjective: Patient reports that her thigh abscesses are improving and the pain is significantly improved.  However she has been having vomiting and diarrhea overnight.  However there is no record of this in the ins and outs.  Vital signs: Temp:  [98.7 F (37.1 C)-100 F (37.8 C)] 98.7 F (37.1 C) (03/07 0454) Pulse Rate:  [86-102] 96 (03/07 1139) Resp:  [18-19] 18 (03/07 1139) BP: (106-123)/(71-79) 115/74 (03/07 1139) SpO2:  [97 %-100 %] 100 % (03/07 1139)   Intake/Output: 03/06 0701 - 03/07 0700 In: 911.5 [IV Piggyback:911.5] Out: 500 [Urine:500] Last BM Date: 06/19/19  Physical Exam: Constitutional: No acute distress Skin: Left inner posterior thigh abscesses with no fluctuance and improving induration.  With no further erythema.  No drainage at this point.  Labs:  Recent Labs    06/20/19 1311  WBC 5.8  HGB 12.0  HCT 37.3  PLT 197   Recent Labs    06/20/19 1311  NA 136  K 4.0  CL 94*  CO2 28  GLUCOSE 103*  BUN 11  CREATININE 0.51  CALCIUM 9.2   No results for input(s): LABPROT, INR in the last 72 hours.  Imaging: DG Abd Portable 2V  Result Date: 06/20/2019 CLINICAL DATA:  Nausea and vomiting per physician order. EXAM: PORTABLE ABDOMEN - 2 VIEW COMPARISON:  None. FINDINGS: There are no dilated loops of bowel to suggest obstruction. No supine evidence for free air. Heavy colonic stool burden. No unexpected radiopaque calcification. No acute osseous abnormality in the visualized skeleton. IMPRESSION: Nonobstructive bowel gas pattern. Heavy colonic stool burden. Electronically Signed   By: Emmaline Kluver M.D.   On: 06/20/2019 15:26    Assessment/Plan: This is a 34 y.o. female with left thigh abscesses.  -Patient is doing well with procedure.  No further needs for surgery at this point.  Continue diabetic diet as tolerated.  Her diarrhea and vomiting could be from antibiotics versus any other potential reasons.  If she has persistent diarrhea may  need C. difficile testing.  At this point otherwise there are no surgical needs.  We will sign off.  Please call us if there are any questions or concerns. --Patient will follow-up with her PCP from the wound standpoint.   Charlotte Ill, MD [facility]

## 2019-06-20 NOTE — Progress Notes (Signed)
Pt was not in room when we went to give shift report. Previous nurse stared last seen at 1900 after giving phenergan and dilaudid. Nursing supervisor notified, security notified. Police went to address listed but it was empty. Cell phone called with no response. Patients mother called but did not know where patient was.Ouma NP notified at 2100.

## 2019-06-20 NOTE — Progress Notes (Addendum)
Notified by RN that patient was not in her room when they checked on her at 2000 after shift change. Last time seen was at 1900 following administration of Phenergan and Dilaudid. Patient left with IV access still in place. Per nursing staff, she has been verbalizing her intentions to leave AMA for several days. Security was contacted and police sent to her address for welfare check but she was not at the address listed in her demographics. Patient's mother Deliah Boston was contacted, she also did not know patient's whereabout. Her cell phone is not accepting phone calls at this time.   Webb Silversmith, DNP, CCRN, FNP-C [facility] Nurse Practitioner Between 7pm to 7am - Pager [phone] Actively using [employer] secure chat messaging  After 7am go to [email or web address] - [email or web address]  [employer]  [phone]

## 2019-06-20 NOTE — Progress Notes (Signed)
Patient turned off IV pump that was running vancomycin because it was burning her hand.  She is also refusing the next dose of ampicillan IV.  I told her that she would have to get po abx.  Will continue to monitor.  Arturo Morton

## 2019-06-20 NOTE — Progress Notes (Signed)
Pharmacy Antibiotic Note  Charlotte Stewart is a 34 y.o. female admitted on 06/15/2019 with cellulitis.  Pharmacy has been consulted for Vancomycin and Unasyn dosing.   Plan: 1) Will continue Vancomycin 1500 mg IV Q24 hrs. Goal AUC 400-550.  Based on subtherapeutic trough.  Estimated AUC = 462, Pk = 36, Trough 10.5 - monitor renal function - recheck levels if remains on vancomycin > 3-4 days or change in renal function - suggest cultures if undergoes another I&D  2) Unasyn 3g IV Q6 hours  Height: 5\' 2"  (157.5 cm) Weight: 242 lb 4.6 oz (109.9 kg) IBW/kg (Calculated) : 50.1  Temp (24hrs), Avg:99.4 F (37.4 C), Min:98.7 F (37.1 C), Max:100 F (37.8 C)  Recent Labs  Lab 06/15/19 2127 06/16/19 0936 06/17/19 0715 06/18/19 0930 06/20/19 1311  WBC 15.6* 10.4 9.3  --  5.8  CREATININE 0.49 0.47  --   --  0.51  LATICACIDVEN 1.2  1.2  --   --   --   --   VANCOTROUGH  --   --   --  4*  --     Estimated Creatinine Clearance: 119 mL/min (by C-G formula based on SCr of 0.51 mg/dL).    Allergies  Allergen Reactions  . Methylprednisolone Swelling  . Aspirin Other (See Comments)    Nose bleeds  . Naloxone Swelling and Other (See Comments)    Throat swelling   . Strawberry Extract     Antimicrobials this admission: Vancomycin 3/2 >>  Unasyn 3/3>> CTX 3/3 x1  Dose adjustments this admission:   Microbiology results:  BCx: NGTD  Wound cx:  pending  MRSA PCR: positive  Thank you for allowing pharmacy to be a part of this patient's care.  08/20/19, PharmD, BCPS 06/20/2019 2:29 PM

## 2019-06-20 NOTE — Progress Notes (Signed)
Patient ID: Charlotte Stewart, female   DOB: 1985-07-06, 34 y.o.   MRN: [id number] [facility] PROGRESS NOTE  Aubry Rankin VQM:[id number] DOB: 1986-03-14 DOA: 06/15/2019 PCP: [facility]  HPI/Subjective: Patient had nausea vomiting since last night.  We tried to settle down the nausea vomiting today but unable to do so so far.  Patient also lost IV access last night.  Patient's abscesses are feeling better and looking better.  Patient does have abdominal pain and diarrhea.  Objective: Vitals:   06/20/19 0454 06/20/19 1139  BP: 106/71 115/74  Pulse: 86 96  Resp: 18 18  Temp: 98.7 F (37.1 C)   SpO2: 97% 100%    Intake/Output Summary (Last 24 hours) at 06/20/2019 1415 Last data filed at 06/20/2019 0400 Gross per 24 hour  Intake 911.45 ml  Output 500 ml  Net 411.45 ml   Filed Weights   06/15/19 2034 06/16/19 0128  Weight: 106.6 kg 109.9 kg    ROS: Review of Systems  Constitutional: Positive for fever. Negative for chills.  Eyes: Negative for blurred vision.  Respiratory: Negative for cough and shortness of breath.   Cardiovascular: Negative for chest pain.  Gastrointestinal: Positive for abdominal pain, diarrhea, nausea and vomiting. Negative for constipation.  Genitourinary: Negative for dysuria.  Musculoskeletal: Negative for joint pain.  Neurological: Negative for dizziness and headaches.   Exam: Physical Exam  Constitutional: She is oriented to person, place, and time.  HENT:  Nose: No mucosal edema.  Mouth/Throat: No oropharyngeal exudate or posterior oropharyngeal edema.  Eyes: Conjunctivae and lids are normal.  Cardiovascular: S1 normal and S2 normal. Exam reveals no gallop.  No murmur heard. Respiratory: No respiratory distress. She has no wheezes. She has no rhonchi. She has no rales.  GI: Soft. There is abdominal tenderness.  Musculoskeletal:     Right ankle: No swelling.     Left ankle: No swelling.  Lymphadenopathy:    She has no cervical  adenopathy.  Neurological: She is alert and oriented to person, place, and time.  Skin: Skin is warm. Nails show no clubbing.  2 buttock abscesses less painful to palpation only one has a slight fluctuance in it. Forehead abscess is much improved and a lot smaller. Small abscess on the bridge of the nose and right eye much improved Small lip ulceration that looks like an aphthous ulcer   Psychiatric: She has a normal mood and affect.      Data Reviewed: Basic Metabolic Panel: Recent Labs  Lab 06/15/19 2127 06/16/19 0936 06/20/19 1311  NA 137 139 136  K 3.5 4.0 4.0  CL 96* 103 94*  CO2 28 26 28   GLUCOSE 110* 150* 103*  BUN 9 10 11   CREATININE 0.49 0.47 0.51  CALCIUM 8.6* 8.4* 9.2   Liver Function Tests: Recent Labs  Lab 06/15/19 2127  AST 20  ALT 31  ALKPHOS 84  BILITOT 0.3  PROT 6.8  ALBUMIN 3.2*  CBC: Recent Labs  Lab 06/15/19 2127 06/16/19 0936 06/17/19 0715 06/20/19 1311  WBC 15.6* 10.4 9.3 5.8  NEUTROABS 12.0*  --   --   --   HGB 12.3 11.9* 11.1* 12.0  HCT 38.7 37.4 35.4* 37.3  MCV 87.6 88.6 88.5 87.6  PLT 240 214 213 197   CBG: Recent Labs  Lab 06/16/19 0154 06/16/19 0806 06/16/19 1223  GLUCAP 172* 119* 137*    Recent Results (from the past 240 hour(s))  MRSA PCR Screening     Status: Abnormal   Collection  Time: 06/15/19  9:27 PM   Specimen: Nasopharyngeal  Result Value Ref Range Status   MRSA by PCR POSITIVE (A) NEGATIVE Final    Comment:        The GeneXpert MRSA Assay (FDA approved for NASAL specimens only), is one component of a comprehensive MRSA colonization surveillance program. It is not intended to diagnose MRSA infection nor to guide or monitor treatment for MRSA infections. RESULT CALLED TO, READ BACK BY AND VERIFIED WITH: Levonne Hubert RN [id number] 06/15/19 HNM Performed at [facility], [address] [place], [place] [address]   Blood culture (routine x 2)     Status: None   Collection Time: 06/15/19  9:27 PM    Specimen: BLOOD  Result Value Ref Range Status   Specimen Description BLOOD LEFT ANTECUBITAL  Final   Special Requests   Final    BOTTLES DRAWN AEROBIC AND ANAEROBIC Blood Culture adequate volume   Culture   Final    NO GROWTH 5 DAYS Performed at [facility], [address] [place], [place] [address]    Report Status 06/20/2019 FINAL  Final  Blood culture (routine x 2)     Status: None   Collection Time: 06/15/19  9:27 PM   Specimen: BLOOD  Result Value Ref Range Status   Specimen Description BLOOD RIGHT ANTECUBITAL  Final   Special Requests   Final    BOTTLES DRAWN AEROBIC AND ANAEROBIC Blood Culture results may not be optimal due to an inadequate volume of blood received in culture bottles   Culture   Final    NO GROWTH 5 DAYS Performed at [facility], [address] [place], [place] [address]    Report Status 06/20/2019 FINAL  Final  Respiratory Panel by RT PCR (Flu A&B, Covid) -     Status: None   Collection Time: 06/15/19  9:27 PM  Result Value Ref Range Status   SARS Coronavirus 2 by RT PCR NEGATIVE NEGATIVE Final    Comment: (NOTE) SARS-CoV-2 target nucleic acids are NOT DETECTED. The SARS-CoV-2 RNA is generally detectable in upper respiratoy specimens during the acute phase of infection. The lowest concentration of SARS-CoV-2 viral copies this assay can detect is 131 copies/mL. A negative result does not preclude SARS-Cov-2 infection and should not be used as the sole basis for treatment or other patient management decisions. A negative result may occur with  improper specimen collection/handling, submission of specimen other than nasopharyngeal swab, presence of viral mutation(s) within the areas targeted by this assay, and inadequate number of viral copies (<131 copies/mL). A negative result must be combined with clinical observations, patient history, and epidemiological information. The expected result is Negative. Fact Sheet for Patients:   PinkCheek.be Fact Sheet for Healthcare Providers:  GravelBags.it This test is not yet ap proved or cleared by the [place] FDA and  has been authorized for detection and/or diagnosis of SARS-CoV-2 by FDA under an Emergency Use Authorization (EUA). This EUA will remain  in effect (meaning this test can be used) for the duration of the COVID-19 declaration under Section 564(b)(1) of the Act, 21 U.S.C. section 360bbb-3(b)(1), unless the authorization is terminated or revoked sooner.    Influenza A by PCR NEGATIVE NEGATIVE Final   Influenza B by PCR NEGATIVE NEGATIVE Final    Comment: (NOTE) The Xpert Xpress SARS-CoV-2/FLU/RSV assay is intended as an aid in  the diagnosis of influenza from Nasopharyngeal swab specimens and  should not be used as a sole basis  for treatment. Nasal washings and  aspirates are unacceptable for Xpert Xpress SARS-CoV-2/FLU/RSV  testing. Fact Sheet for Patients: https://www.moore.com/ Fact Sheet for Healthcare Providers: https://www.young.biz/ This test is not yet approved or cleared by the [place] FDA and  has been authorized for detection and/or diagnosis of SARS-CoV-2 by  FDA under an Emergency Use Authorization (EUA). This EUA will remain  in effect (meaning this test can be used) for the duration of the  Covid-19 declaration under Section 564(b)(1) of the Act, 21  U.S.C. section 360bbb-3(b)(1), unless the authorization is  terminated or revoked. Performed at [facility], [address] [place], [place] [address]      Scheduled Meds: . buprenorphine  8 mg Sublingual BID  . buPROPion  100 mg Oral Daily  . Chlorhexidine Gluconate Cloth  6 each Topical Q0600  . gabapentin  1,200 mg Oral BID  . gentamicin ointment   Topical TID  . mirtazapine  45 mg Oral QHS  . mupirocin ointment  1 application Nasal BID  . nicotine  21 mg  Transdermal Daily  . promethazine  12.5 mg Intravenous Once  . rOPINIRole  0.25 mg Oral QHS   Continuous Infusions:  Assessment/Plan:  1. Persistent nausea and vomiting.  Unable to settle down with orally disintegrating Zofran and oral medications.  Needed IV to get IV medications in.  Hesitant on sending this patient home without tolerating oral antibiotics.  Abdominal x-ray.  Been in the hospital too long to send off stool studies at this point. 2. Multiple abscesses.  Much improved since when she came in.  I was hoping to get her home today but with the nausea vomiting unable to do so.  Switch antibiotics back to IV. 3. Chronic pain on Subutex 4. Anxiety depression on Wellbutrin Remeron and Zoloft 5. Vaginal yeast infection given Diflucan yesterday  Code Status:     Code Status Orders  (From admission, onward)         Start     Ordered   06/16/19 0023  Full code  Continuous     06/16/19 0022        Code Status History    Date Active Date Inactive Code Status Order ID Comments User Context   09/30/2018 1617 09/30/2018 2112 Full Code [id number]  Oswaldo Conroy, CNM Inpatient   07/08/2018 1702 07/08/2018 2210 Full Code [id number]  Oswaldo Conroy, CNM Inpatient   06/11/2018 0217 06/11/2018 0900 Full Code [id number]  Tresea Mall, CNM Inpatient   Advance Care Planning Activity     Disposition Plan: Must be able to tolerate oral antibiotics and eat prior to discharge home.  Evaluate on a daily basis on when to go home.  Potentially tomorrow if able to tolerate food and oral antibiotic  Antibiotics:  Switch back to IV vancomycin and Zosyn  Time spent: 28 minutes.  Case discussed with general surgery.  Nurse Tia present during interview and an entire exam.  Alford Highland  [facility]

## 2019-06-20 NOTE — Plan of Care (Signed)
Patient is NPO after midnight in case she goes for a procedure.  Will continue to monitor.  Arturo Morton

## 2019-06-21 NOTE — Discharge Summary (Signed)
[facility] [place] [facility]   PATIENT NAME: Charlotte Stewart    MR#:  [id number]  DATE OF BIRTH:  [birth year]  DATE OF ADMISSION:  06/15/2019 ADMITTING PHYSICIAN: Anselm Jungling, DO  DATE OF signing out against medical advise: 06/20/2019  8:00 PM  PRIMARY CARE PHYSICIAN: [facility]    ADMISSION DIAGNOSIS:  Cellulitis [L03.90] MRSA (methicillin resistant Staphylococcus aureus) [A49.02] Facial cellulitis [L03.211] Sepsis due to cellulitis (HCC) [L03.90, A41.9]  DISCHARGE DIAGNOSIS:  Principal Problem:   Cellulitis Active Problems:   Abscess   Tobacco abuse   Anxiety and depression   Type 2 diabetes mellitus without complication (HCC)   Polysubstance abuse (HCC)   Obesity, Class III, BMI 40-49.9 (morbid obesity) (HCC)   Abscess of left buttock   Abscess of forehead   Impaired fasting glucose   Other chronic pain   Vagina, candidiasis   Intractable vomiting   SECONDARY DIAGNOSIS:   Past Medical History:  Diagnosis Date  . Hypertension     HOSPITAL COURSE:   1.  Multiple abscesses.  Patient had 2 abscesses on her left buttock that were lanced in the ED.  Patient was seen by general surgery and did not feel that these needed to be lanced any further.  They are much improved since coming into the hospital.  Patient also had 2 forehead abscesses and patient was seen by ENT and these are much improved since when she came in.  She also had a right bridge of the nose abscess which is also much improved since when she came in.  The patient signed out AGAINST MEDICAL ADVICE on 06/20/2019.  I tried to reach her by telephone on 06/21/2019 but her phone number just kept on ringing.  I called in doxycycline 100 mg twice a day for 7 days into her pharmacy that was listed into the computer. 2.  Persistent nausea and vomiting and diarrhea.  On 06/20/2019 she stated that she was up with nausea vomiting all night and had 10 episodes of diarrhea.  She was in the  hospital too long to send off stool studies.  We had to place an IV back and and start IV Phenergan.  Abdominal x-ray did show heavy stool burden in the colon.  Patient must of been feeling better in the evening of 06/20/2019 because she signed out AGAINST MEDICAL ADVICE. 3.  Patient takes chronic Subutex 4.  Anxiety depression on Wellbutrin Remeron and Zoloft 5.  Vaginal yeast infection patient was given a dose of Diflucan on 06/19/2019.  DISCHARGE CONDITIONS:   Patient signed out AMA  CONSULTS OBTAINED:  General surgery ENT  DRUG ALLERGIES:   Allergies  Allergen Reactions  . Methylprednisolone Swelling  . Aspirin Other (See Comments)    Nose bleeds  . Naloxone Swelling and Other (See Comments)    Throat swelling   . Strawberry Extract     DISCHARGE MEDICATIONS:   Patient left AMA so there was no discharging medications at the time that she left.  On 06/21/2019 I tried to contact the patient on her phone but did not get in contact with her.  I did call in a week's worth of doxycycline into the pharmacy listed in the computer.  DISCHARGE INSTRUCTIONS:   Patient left AGAINST MEDICAL ADVICE  CHIEF COMPLAINT:   Chief Complaint  Patient presents with  . Recurrent Skin Infections    HISTORY OF PRESENT ILLNESS:  Charlotte Stewart  is a 34 y.o. female came in with multiple  abscesses   VITAL SIGNS:  Blood pressure 115/74, pulse 96, temperature 98.7 F (37.1 C), resp. rate 18, height 5\' 2"  (1.575 m), weight 109.9 kg, SpO2 100 %, not currently breastfeeding.   DATA REVIEW:   CBC Recent Labs  Lab 06/20/19 1311  WBC 5.8  HGB 12.0  HCT 37.3  PLT 197    Chemistries  Recent Labs  Lab 06/15/19 2127 06/16/19 0936 06/20/19 1311  NA 137   < > 136  K 3.5   < > 4.0  CL 96*   < > 94*  CO2 28   < > 28  GLUCOSE 110*   < > 103*  BUN 9   < > 11  CREATININE 0.49   < > 0.51  CALCIUM 8.6*   < > 9.2  AST 20  --   --   ALT 31  --   --   ALKPHOS 84  --   --   BILITOT 0.3  --    --    < > = values in this interval not displayed.    Microbiology Results  Results for orders placed or performed during the hospital encounter of 06/15/19  MRSA PCR Screening     Status: Abnormal   Collection Time: 06/15/19  9:27 PM   Specimen: Nasopharyngeal  Result Value Ref Range Status   MRSA by PCR POSITIVE (A) NEGATIVE Final    Comment:        The GeneXpert MRSA Assay (FDA approved for NASAL specimens only), is one component of a comprehensive MRSA colonization surveillance program. It is not intended to diagnose MRSA infection nor to guide or monitor treatment for MRSA infections. RESULT CALLED TO, READ BACK BY AND VERIFIED WITH: 08/15/19 RN [id number] 06/15/19 HNM Performed at [facility], [address] [place], [place]   Blood culture (routine x 2)     Status: None   Collection Time: 06/15/19  9:27 PM   Specimen: BLOOD  Result Value Ref Range Status   Specimen Description BLOOD LEFT ANTECUBITAL  Final   Special Requests   Final    BOTTLES DRAWN AEROBIC AND ANAEROBIC Blood Culture adequate volume   Culture   Final    NO GROWTH 5 DAYS Performed at [facility], [address] [place], [place]    Report Status 06/20/2019 FINAL  Final  Blood culture (routine x 2)     Status: None   Collection Time: 06/15/19  9:27 PM   Specimen: BLOOD  Result Value Ref Range Status   Specimen Description BLOOD RIGHT ANTECUBITAL  Final   Special Requests   Final    BOTTLES DRAWN AEROBIC AND ANAEROBIC Blood Culture results may not be optimal due to an inadequate volume of blood received in culture bottles   Culture   Final    NO GROWTH 5 DAYS Performed at [facility], [address] [place], [place]    Report Status 06/20/2019 FINAL  Final  Respiratory Panel by RT PCR (Flu A&B, Covid) -     Status: None   Collection Time: 06/15/19  9:27 PM  Result Value Ref Range Status   SARS Coronavirus 2 by RT PCR NEGATIVE  NEGATIVE Final    Comment: (NOTE) SARS-CoV-2 target nucleic acids are NOT DETECTED. The SARS-CoV-2 RNA is generally detectable in upper respiratoy specimens during the acute phase of infection. The lowest concentration of SARS-CoV-2 viral copies this assay can detect is 131 copies/mL. A negative  result does not preclude SARS-Cov-2 infection and should not be used as the sole basis for treatment or other patient management decisions. A negative result may occur with  improper specimen collection/handling, submission of specimen other than nasopharyngeal swab, presence of viral mutation(s) within the areas targeted by this assay, and inadequate number of viral copies (<131 copies/mL). A negative result must be combined with clinical observations, patient history, and epidemiological information. The expected result is Negative. Fact Sheet for Patients:  PinkCheek.be Fact Sheet for Healthcare Providers:  GravelBags.it This test is not yet ap proved or cleared by the [place] FDA and  has been authorized for detection and/or diagnosis of SARS-CoV-2 by FDA under an Emergency Use Authorization (EUA). This EUA will remain  in effect (meaning this test can be used) for the duration of the COVID-19 declaration under Section 564(b)(1) of the Act, 21 U.S.C. section 360bbb-3(b)(1), unless the authorization is terminated or revoked sooner.    Influenza A by PCR NEGATIVE NEGATIVE Final   Influenza B by PCR NEGATIVE NEGATIVE Final    Comment: (NOTE) The Xpert Xpress SARS-CoV-2/FLU/RSV assay is intended as an aid in  the diagnosis of influenza from Nasopharyngeal swab specimens and  should not be used as a sole basis for treatment. Nasal washings and  aspirates are unacceptable for Xpert Xpress SARS-CoV-2/FLU/RSV  testing. Fact Sheet for Patients: PinkCheek.be Fact Sheet for Healthcare  Providers: GravelBags.it This test is not yet approved or cleared by the [place] FDA and  has been authorized for detection and/or diagnosis of SARS-CoV-2 by  FDA under an Emergency Use Authorization (EUA). This EUA will remain  in effect (meaning this test can be used) for the duration of the  Covid-19 declaration under Section 564(b)(1) of the Act, 21  U.S.C. section 360bbb-3(b)(1), unless the authorization is  terminated or revoked. Performed at [facility], [address] [place], [place] [address]     RADIOLOGY:  DG Abd Portable 2V  Result Date: 06/20/2019 CLINICAL DATA:  Nausea and vomiting per physician order. EXAM: PORTABLE ABDOMEN - 2 VIEW COMPARISON:  None. FINDINGS: There are no dilated loops of bowel to suggest obstruction. No supine evidence for free air. Heavy colonic stool burden. No unexpected radiopaque calcification. No acute osseous abnormality in the visualized skeleton. IMPRESSION: Nonobstructive bowel gas pattern. Heavy colonic stool burden. Electronically Signed   By: Audie Pinto M.D.   On: 06/20/2019 15:26     CODE STATUS:  Code Status History    Date Active Date Inactive Code Status Order ID Comments User Context   06/16/2019 0022 06/21/2019 0411 Full Code [id number]  Orene Desanctis, DO ED   09/30/2018 1617 09/30/2018 2112 Full Code [id number]  Rexene Agent, CNM Inpatient   07/08/2018 1702 07/08/2018 2210 Full Code [id number]  Rexene Agent, CNM Inpatient   06/11/2018 0217 06/11/2018 0900 Full Code [id number]  Rod Can, CNM Inpatient   Advance Care Planning Activity       Loletha Grayer M.D on 06/21/2019 at 12:07 PM  Between 7am to 6pm - Pager - [phone]  After 6pm go to [email or web address] - password [email or web address]  [facility]  CC: Primary care physician; [facility] [place]

## 2019-08-21 ENCOUNTER — Encounter: Payer: Self-pay | Admitting: Emergency Medicine

## 2019-08-21 ENCOUNTER — Other Ambulatory Visit: Payer: Self-pay

## 2019-08-21 DIAGNOSIS — Z7151 Drug abuse counseling and surveillance of drug abuser: Secondary | ICD-10-CM | POA: Insufficient documentation

## 2019-08-21 DIAGNOSIS — R21 Rash and other nonspecific skin eruption: Secondary | ICD-10-CM | POA: Diagnosis present

## 2019-08-21 DIAGNOSIS — R39198 Other difficulties with micturition: Secondary | ICD-10-CM | POA: Insufficient documentation

## 2019-08-21 DIAGNOSIS — Z79899 Other long term (current) drug therapy: Secondary | ICD-10-CM | POA: Diagnosis not present

## 2019-08-21 DIAGNOSIS — E119 Type 2 diabetes mellitus without complications: Secondary | ICD-10-CM | POA: Diagnosis not present

## 2019-08-21 DIAGNOSIS — F17200 Nicotine dependence, unspecified, uncomplicated: Secondary | ICD-10-CM | POA: Insufficient documentation

## 2019-08-21 DIAGNOSIS — Z794 Long term (current) use of insulin: Secondary | ICD-10-CM | POA: Diagnosis not present

## 2019-08-21 DIAGNOSIS — L03818 Cellulitis of other sites: Secondary | ICD-10-CM | POA: Insufficient documentation

## 2019-08-21 DIAGNOSIS — F918 Other conduct disorders: Secondary | ICD-10-CM | POA: Diagnosis not present

## 2019-08-21 DIAGNOSIS — R55 Syncope and collapse: Secondary | ICD-10-CM | POA: Insufficient documentation

## 2019-08-21 DIAGNOSIS — I10 Essential (primary) hypertension: Secondary | ICD-10-CM | POA: Insufficient documentation

## 2019-08-21 NOTE — ED Triage Notes (Addendum)
Patient with multiple red areas to face and legs. Patient states that they have been there for a couple of months. Patient states that she has been seen for it before and was diagnosed with MRSA.  Patient states that now she has not been able to urinate times two days.

## 2019-08-21 NOTE — ED Notes (Signed)
Patient up to stat desk asking about wait time.  Stat registration clerk attempting to explain wait time to patient and patient ask how long she had been here and when clerk told for for 2 and 1/2 hours patient stated that wasn't right she had been here since 7 pm.  Explained to patient that a timer starts when she is put in the computer.  Patient then yelling a clerk saying it was ridiculous to have to wait like this then yelling around the lobby asking for a I-phone charger.

## 2019-08-21 NOTE — ED Notes (Signed)
Pt refused blood work unless she has an iv.

## 2019-08-21 NOTE — ED Notes (Signed)
Bladder scanner reveals .

## 2019-08-22 ENCOUNTER — Emergency Department
Admission: EM | Admit: 2019-08-22 | Discharge: 2019-08-22 | Disposition: A | Discharge status: Home / Self Care | Payer: Medicaid Other | Attending: Student | Admitting: Student

## 2019-08-22 DIAGNOSIS — L03818 Cellulitis of other sites: Secondary | ICD-10-CM

## 2019-08-22 LAB — CBC
HCT: 39.9 % (ref 36.0–46.0)
Hemoglobin: 13 g/dL (ref 12.0–15.0)
MCH: 27.8 pg (ref 26.0–34.0)
MCHC: 32.6 g/dL (ref 30.0–36.0)
MCV: 85.4 fL (ref 80.0–100.0)
Platelets: 292 10*3/uL (ref 150–400)
RBC: 4.67 MIL/uL (ref 3.87–5.11)
RDW: 12.8 % (ref 11.5–15.5)
WBC: 6.8 10*3/uL (ref 4.0–10.5)
nRBC: 0 % (ref 0.0–0.2)

## 2019-08-22 LAB — COMPREHENSIVE METABOLIC PANEL
ALT: 38 U/L (ref 0–44)
AST: 18 U/L (ref 15–41)
Albumin: 3.8 g/dL (ref 3.5–5.0)
Alkaline Phosphatase: 103 U/L (ref 38–126)
Anion gap: 8 (ref 5–15)
BUN: 14 mg/dL (ref 6–20)
CO2: 27 mmol/L (ref 22–32)
Calcium: 9.3 mg/dL (ref 8.9–10.3)
Chloride: 101 mmol/L (ref 98–111)
Creatinine, Ser: 0.58 mg/dL (ref 0.44–1.00)
GFR calc Af Amer: >60 mL/min (ref >60)
GFR calc non Af Amer: >60 mL/min (ref >60)
Glucose, Bld: 107 mg/dL — ABNORMAL HIGH (ref 70–99)
Potassium: 3.9 mmol/L (ref 3.5–5.1)
Sodium: 136 mmol/L (ref 135–145)
Total Bilirubin: 0.4 mg/dL (ref 0.3–1.2)
Total Protein: 7.6 g/dL (ref 6.5–8.1)

## 2019-08-22 LAB — GLUCOSE, CAPILLARY: Glucose-Capillary: 108 mg/dL — ABNORMAL HIGH (ref 70–99)

## 2019-08-22 LAB — CBC WITH DIFFERENTIAL/PLATELET
Abs Immature Granulocytes: 0.03 10*3/uL (ref 0.00–0.07)
Basophils Absolute: 0 10*3/uL (ref 0.0–0.1)
Basophils Relative: 0 %
Eosinophils Absolute: 0.1 10*3/uL (ref 0.0–0.5)
Eosinophils Relative: 2 %
HCT: 40.5 % (ref 36.0–46.0)
Hemoglobin: 13 g/dL (ref 12.0–15.0)
Immature Granulocytes: 0 %
Lymphocytes Relative: 36 %
Lymphs Abs: 2.5 10*3/uL (ref 0.7–4.0)
MCH: 27.5 pg (ref 26.0–34.0)
MCHC: 32.1 g/dL (ref 30.0–36.0)
MCV: 85.6 fL (ref 80.0–100.0)
Monocytes Absolute: 0.5 10*3/uL (ref 0.1–1.0)
Monocytes Relative: 7 %
Neutro Abs: 3.6 10*3/uL (ref 1.7–7.7)
Neutrophils Relative %: 55 %
Platelets: 298 10*3/uL (ref 150–400)
RBC: 4.73 MIL/uL (ref 3.87–5.11)
RDW: 12.6 % (ref 11.5–15.5)
WBC: 6.8 10*3/uL (ref 4.0–10.5)
nRBC: 0 % (ref 0.0–0.2)

## 2019-08-22 NOTE — ED Notes (Signed)
Dr. Colon Branch at patient's bedside to explain to patient blood work and plan of care. Patient begins yelling at Dr. Colon Branch and takes the pulse ox off and throws it toward Dr. Colon Branch. Dr. Colon Branch asked patient to please stop yelling at her. Patient continued to yell at staff stating that no one wants to help here and that we are fucking bitches. Patient yells that she wants detox from heroin. Dr. Colon Branch attempts to tell the patient that she will be glad to give her resources but the patient continues to yell over Dr. Colon Branch. Patient informed that she is medically stable and that she is for discharge. Patient continues to yell and cuss at staff. Patient walks out refusing discharge instructions and vital signs.

## 2019-08-22 NOTE — ED Notes (Signed)
Pt stating that she will allow blood work through [place] IV. Danelle Earthly RN has been called and will attempt IV access with [place]. Pt states that if we do not admit her that she will leave to go to another hospital. Pt informed by this RN that it is an option if she feels it necessary. Pt telling this RN to stop talking and to leave her room at this time.

## 2019-08-22 NOTE — ED Provider Notes (Signed)
[facility] Emergency Department Provider Note  ____________________________________________   First MD Initiated Contact with Patient 08/22/19 0121     (approximate)  I have reviewed the triage vital signs and the nursing notes.  History  Chief Complaint Wound Check    HPI Charlotte Stewart is a 34 y.o. female with history of HTN, recurrent/multiple abscesses, DM who presents to the emergency department with concern for recurrent abscesses/cellulitis.  On chart review, patient was admitted from 3/2-3/7 for sepsis 2/2 multiple areas of cellulitis + abscesses, including her buttock, legs, and forehead area. Had a left leg abscess I&D in ED.  Patient ultimately signed out from her hospitalization AMA.  She does confirm this on interview, and states she had to leave due to issues with childcare.  Now she states she is here and is ready and able to receive care and to be admitted. She says she has not been told why she gets recurrent abscesses. She denies any drug use.   She becomes emotionally labile and says she is also not safe to go home because yesterday she passed out and her friend told her she stopped breathing for 3 minutes.  She immediately states after this "don't ask me about drugs, I don't use them" and then refuses to elaborate any further about this episode.   [Later in the visit (see below) she admits to drugs use and overdosing yesterday as the reason for the episode and asks for resources. Offered to provide her a handout/information for substance use assistance/detox clinics. She then yells at me that the resources are for outpatient clinics and not for admission. She then bargains by threatening further drug use if discharged stating, "well then I'm just going to go back out and use drugs again."]  Discussed with patient that I was reassured by her initial vital signs as she had previously been admitted for sepsis, but is afebrile, normotensive, not  tachycardic here.  Patient became extremely upset by the idea that she may not require admission and said "you cannot discharge me, I refuse to be discharged".  I explained to her that we would need to repeat blood work here first and could certainly offer her a first dose of IV vancomycin (hx MRSA) while waiting.   She then began yelling and raised her voice again, became verbally aggressive, cursing and threatened me, stating to the RN who was also at bedside "you better get her [pointing to me] out of here".  Patient then stated she refused to receive further care from me and proceeded to fire me as her physician. At that point I exited her room. Charge RN made aware.    Past Medical Hx Past Medical History:  Diagnosis Date  . Hypertension     Problem List Patient Active Problem List   Diagnosis Date Noted  . Intractable vomiting   . Vagina, candidiasis   . Other chronic pain   . Cellulitis 06/16/2019  . Abscess 06/16/2019  . Tobacco abuse 06/16/2019  . Anxiety and depression 06/16/2019  . Type 2 diabetes mellitus without complication (HCC) 06/16/2019  . Polysubstance abuse (HCC) 06/16/2019  . Obesity, Class III, BMI 40-49.9 (morbid obesity) (HCC) 06/16/2019  . Abscess of left buttock   . Abscess of forehead   . Impaired fasting glucose   . Indication for care in labor and delivery, antepartum 06/11/2018  . Encounter for repeat ultrasound for low lying placenta, antepartum 06/11/2018    Past Surgical Hx Past Surgical History:  Procedure Laterality Date  . TONSILLECTOMY      Medications Prior to Admission medications   Medication Sig Start Date End Date Taking? Authorizing Provider  buprenorphine (SUBUTEX) 8 MG SUBL SL tablet Place under the tongue 2 (two) times daily.    [provider]  buPROPion (WELLBUTRIN) 100 MG tablet Take 100 mg by mouth daily.     [provider]  clindamycin (CLEOCIN) 300 MG capsule Take 300 mg by mouth 3 (three) times daily.     [provider]  gabapentin (NEURONTIN) 600 MG tablet Take 1,200 mg by mouth 2 (two) times daily.  04/19/19   [provider]  hydrOXYzine (VISTARIL) 50 MG capsule Take 1 capsule (50 mg total) by mouth 3 (three) times daily as needed for itching. Patient not taking: Reported on 06/16/2019 03/23/19   Cuthriell, Delorise Royals, PA-C  insulin glargine (LANTUS) 100 UNIT/ML injection Inject 10-15 Units into the skin daily.    [provider]  mirtazapine (REMERON) 45 MG tablet Take 45 mg by mouth at bedtime.    [provider]  ondansetron (ZOFRAN ODT) 4 MG disintegrating tablet Take 1 tablet (4 mg total) by mouth every 8 (eight) hours as needed for nausea or vomiting. 05/01/18   Emily Filbert, MD  prednisoLONE 5 MG (21) TBPK Take by mouth.    [provider]  rOPINIRole (REQUIP) 0.25 MG tablet Take 0.25 mg by mouth at bedtime.    [provider]  sertraline (ZOLOFT) 50 MG tablet Take 50 mg by mouth daily.    [provider]  triamcinolone cream (KENALOG) 0.1 % Apply 1 application topically 4 (four) times daily. Patient not taking: Reported on 06/16/2019 03/23/19   Cuthriell, Delorise Royals, PA-C    Allergies Methylprednisolone, Aspirin, Naloxone, and Strawberry extract  Family Hx No family history on file.  Social Hx Social History   Tobacco Use  . Smoking status: Current Every Day Smoker    Packs/day: 0.25  . Smokeless tobacco: Never Used  Substance Use Topics  . Alcohol use: Not Currently  . Drug use: Not Currently     Review of Systems Unable to reliably or comprehensively obtain due to lack of patient participation.   Physical Exam  Vital Signs: ED Triage Vitals  Enc Vitals Group     BP 08/21/19 2119 129/88     Pulse Rate 08/21/19 2119 98     Resp 08/21/19 2119 18     Temp 08/21/19 2119 98 F (36.7 C)     Temp Source 08/21/19 2119 Oral     SpO2 08/21/19 2119 98 %     Weight 08/21/19 2120 226 lb (102.5 kg)      Height 08/21/19 2120 5\' 2"  (1.575 m)     Head Circumference --      Peak Flow --      Pain Score 08/21/19 2120 6     Pain Loc --      Pain Edu? --      Excl. in GC? --     Constitutional: Alert and oriented.  Head: Normocephalic. Atraumatic. Eyes: Conjunctivae clear. Sclera anicteric. Pupils equal and symmetric. Nose: No masses or lesions. No congestion or rhinorrhea. Mouth/Throat: Poor dentition. Neck: No stridor. Trachea midline.  Cardiovascular: Normal rate. Extremities well perfused. Respiratory: Normal respiratory effort.   Genitourinary: Deferred. Musculoskeletal: No lower extremity edema. No deformities. Neurologic:  Normal speech and language. No gross focal or lateralizing neurologic deficits are appreciated.  Skin: Several scattered areas of  erythema and induration including her leg, right lateral abdominal wall.  No palpable fluctuance or active drainage. Several areas of excoriations and picking, especially on her forehead/hair line. Findings seem consistent with skin picking habit.  Psychiatric: Very emotionally labile, became aggressive, yelling, and cursing at me.  Verbally threatened me and then proceeded to fire me as her physician. See documentation below and RN documentation as well.   Note: exam limited in comprehensiveness due to lace of patient cooperation   Procedures  Procedure(s) performed (including critical care):  Procedures   Initial Impression / Assessment and Plan / MDM / ED Course  34 y.o. female  with history of HTN, recurrent/multiple abscesses, DM who presents to the emergency department with concern for recurrent abscesses/cellulitis.  On chart review, patient was admitted from 3/2-3/7 for sepsis 2/2 multiple areas of cellulitis + abscesses, including her buttock, legs, and forehead area. Had a left leg abscess I&D in ED.  Patient ultimately signed out from her hospitalization AMA.  She does confirm this on interview, and states she had to leave  due to issues with childcare.  Now she states she is here and is ready and able to receive care and to be admitted. She says she has not been told why she gets recurrent abscesses. She denies any drug use.   She becomes emotionally labile and says she is also not safe to go home because yesterday she passed out and her friend told her she stopped breathing for 3 minutes.  She immediately states after this "don't ask me about drugs, I don't use them" and then refuses to elaborate any further about this episode.   [Later in the visit (see below) she admits to drugs use and overdosing yesterday as the reason for the episode and asks for resources. Offered to provide her a handout/information for substance use assistance/detox clinics. She then yells at me that the resources are for outpatient clinics and not for admission. She then bargains by threatening further drug use if discharged stating, "well then I'm just going to go back out and use drugs again."]  Discussed with patient that I was reassured by her initial vital signs as she had previously been admitted for sepsis, but is afebrile, normotensive, not tachycardic here.  Patient became extremely upset by the idea that she may not require admission and said "you cannot discharge me, I refuse to be discharged".  I explained to her that we would need to repeat blood work here first and could certainly offer her a first dose of IV vancomycin (hx MRSA) while waiting.   She then began yelling and raised her voice again, became verbally aggressive, cursing and threatened me, stating to the RN who was also at bedside "you better get her [pointing to me] out of here".  Patient then stated she refused to receive further care from me and proceeded to fire me as her physician. At that point I exited her room. Charge RN made aware.  Charge RN had discussion with the patient after this. Patient now agreeable to at least screening blood work if done by [place] [facility]. Patient  then refused [place] [facility] offer and demanded food.  Clinical Course as of Aug 21 508  Sun Aug 22, 2019  0215 Patient now apologizing for her behavior and will allow for IV and blood work.    [SM]  8144 Labs are largely unremarkable and reassuring.  Normal WBC count, no left shift or neutrophil predominance.  Electrolytes without actionable derangements.  As such, do not feel presentation requires admission.  Do feel she would benefit from a course of oral antibiotics, and would plan to double cover with both Keflex and Bactrim for MRSA control.  Will attempt to discuss this with patient.   [SM]  0300 I entered the patient's room with her RN and asked her if she would be comfortable/agreeable with me updating her on her lab results.  She stated she was okay with this.  Updated patient on her results, and plan for course of oral antibiotics, as her lab work is reassuring w/o indication for admission. She immediately became very upset and argumentative and raised her voice at the fact that she was being discharged. I did offer her a first dose of antibiotics IV, but she then yells "I don't care anymore!" She took off her pulse ox and threw it in my direction. She then tells me that yesterday she overdosed on IV drugs  which was the reason for her passing out episode and would like help with detox.  I explained that I would be more than happy to provide her with information for resources for clinics to assist with her detox.  She became extremely angry with this option and began cussing and yelling at me because the resources were outpatient based and not for admission. She then threatened relapse and stated "well then I'm just going to go back out and use drugs again". Attempted to calmly redirect the patient but she continues to yell and talk over me. I did calmly state that I would get her discharge papers together along with her prescriptions and compile resources. She then fired me again as her physician.  Security was asked to help plan to escort the patient from the ER as she continued to state she refused discharge. She left without her prescriptions.    [SM]    Clinical Course User Index [SM] Miguel Aschoff., MD     _______________________________   As part of my medical decision making I have reviewed reviewed old records/performed chart review.    Final Clinical Impression(s) / ED Diagnosis  Recurrent cellulitis    Note:  This document was prepared using Dragon voice recognition software and may include unintentional dictation errors.   Miguel Aschoff., MD 08/22/19 [phone]

## 2019-08-22 NOTE — ED Notes (Signed)
Patient to stat desk asking about wait time. Patient given an update on wait on time. Patient states that this is ridiculous and walks away.

## 2019-08-22 NOTE — ED Notes (Addendum)
MD at bedside. Pt yelling at Md stating "you're fired, I don't want you as my fucking doctor. You better get out of here or else". Patient unable to be redirected by Md at this time. Pt refuses blood work. Pt states she needs to be admitted and that she needs something to eat.
